# Patient Record
Sex: Male | Born: 1980 | Race: White | Hispanic: No | Marital: Married | Smoking: Never smoker
Health system: Southern US, Community
[De-identification: ages and names within clinical notes are randomized; demographics above are authoritative.]

## PROBLEM LIST (undated history)

## (undated) DIAGNOSIS — G43909 Migraine, unspecified, not intractable, without status migrainosus: Secondary | ICD-10-CM

## (undated) DIAGNOSIS — M549 Dorsalgia, unspecified: Secondary | ICD-10-CM

## (undated) DIAGNOSIS — S069X9A Unspecified intracranial injury with loss of consciousness of unspecified duration, initial encounter: Secondary | ICD-10-CM

## (undated) DIAGNOSIS — F4310 Post-traumatic stress disorder, unspecified: Secondary | ICD-10-CM

## (undated) DIAGNOSIS — S069XAA Unspecified intracranial injury with loss of consciousness status unknown, initial encounter: Secondary | ICD-10-CM

## (undated) DIAGNOSIS — H9190 Unspecified hearing loss, unspecified ear: Secondary | ICD-10-CM

## (undated) DIAGNOSIS — G8929 Other chronic pain: Secondary | ICD-10-CM

## (undated) HISTORY — PX: NASAL SEPTUM SURGERY: SHX37

## (undated) HISTORY — PX: HYDROCELE EXCISION: SHX482

---

## 2013-10-06 ENCOUNTER — Encounter (HOSPITAL_BASED_OUTPATIENT_CLINIC_OR_DEPARTMENT_OTHER): Payer: Self-pay | Admitting: Emergency Medicine

## 2013-10-06 ENCOUNTER — Emergency Department (HOSPITAL_BASED_OUTPATIENT_CLINIC_OR_DEPARTMENT_OTHER)
Admission: EM | Admit: 2013-10-06 | Discharge: 2013-10-06 | Disposition: A | Discharge status: Home / Self Care | Attending: Emergency Medicine | Admitting: Emergency Medicine

## 2013-10-06 ENCOUNTER — Emergency Department (HOSPITAL_BASED_OUTPATIENT_CLINIC_OR_DEPARTMENT_OTHER)

## 2013-10-06 DIAGNOSIS — R5381 Other malaise: Secondary | ICD-10-CM | POA: Insufficient documentation

## 2013-10-06 DIAGNOSIS — Z791 Long term (current) use of non-steroidal anti-inflammatories (NSAID): Secondary | ICD-10-CM | POA: Insufficient documentation

## 2013-10-06 DIAGNOSIS — M79609 Pain in unspecified limb: Secondary | ICD-10-CM | POA: Insufficient documentation

## 2013-10-06 DIAGNOSIS — F4310 Post-traumatic stress disorder, unspecified: Secondary | ICD-10-CM | POA: Insufficient documentation

## 2013-10-06 DIAGNOSIS — Z79899 Other long term (current) drug therapy: Secondary | ICD-10-CM | POA: Insufficient documentation

## 2013-10-06 DIAGNOSIS — R5383 Other fatigue: Secondary | ICD-10-CM

## 2013-10-06 DIAGNOSIS — M79641 Pain in right hand: Secondary | ICD-10-CM

## 2013-10-06 DIAGNOSIS — Z87820 Personal history of traumatic brain injury: Secondary | ICD-10-CM | POA: Insufficient documentation

## 2013-10-06 DIAGNOSIS — G8929 Other chronic pain: Secondary | ICD-10-CM | POA: Insufficient documentation

## 2013-10-06 DIAGNOSIS — H9190 Unspecified hearing loss, unspecified ear: Secondary | ICD-10-CM | POA: Insufficient documentation

## 2013-10-06 HISTORY — DX: Dorsalgia, unspecified: M54.9

## 2013-10-06 HISTORY — DX: Unspecified intracranial injury with loss of consciousness status unknown, initial encounter: S06.9XAA

## 2013-10-06 HISTORY — DX: Unspecified hearing loss, unspecified ear: H91.90

## 2013-10-06 HISTORY — DX: Post-traumatic stress disorder, unspecified: F43.10

## 2013-10-06 HISTORY — DX: Unspecified intracranial injury with loss of consciousness of unspecified duration, initial encounter: S06.9X9A

## 2013-10-06 HISTORY — DX: Other chronic pain: G89.29

## 2013-10-06 MED ORDER — HYDROCODONE-ACETAMINOPHEN 5-325 MG PO TABS: 1.0000 | ORAL_TABLET | Freq: Four times a day (QID) | ORAL | Status: DC | PRN

## 2013-10-06 MED ORDER — NAPROXEN 500 MG PO TABS: 500.0000 mg | ORAL_TABLET | Freq: Two times a day (BID) | ORAL | Status: DC

## 2013-10-06 NOTE — ED Provider Notes (Signed)
CSN: [id number]     Arrival date & time 10/06/13  1533 History   First MD Initiated Contact with Patient 10/06/13 1547     Chief Complaint  Patient presents with  . Wrist Pain     (Consider location/radiation/quality/duration/timing/severity/associated sxs/prior Treatment) Patient is a 33 y.o. male presenting with wrist pain. The history is provided by the patient.  Wrist Pain Pertinent negatives include no chest pain, no abdominal pain, no headaches and no shortness of breath.   patient with complaint of pain to the right hyperthenar area of his hand. Been present for 2 weeks no apparent injury. Patient denies any swelling or redness. Patient states his grip strength is weakened. No forearm pain. Pain does radiate into the wrist. Pain is 5/10. Pain is described as a sharp ache.  Past Medical History  Diagnosis Date  . Chronic back pain   . PTSD (post-traumatic stress disorder)   . TBI (traumatic brain injury)   . HOH (hard of hearing)    Past Surgical History  Procedure Laterality Date  . Back surgery     History reviewed. No pertinent family history. History  Substance Use Topics  . Smoking status: Not on file  . Smokeless tobacco: Not on file  . Alcohol Use: No    Review of Systems  Constitutional: Negative for fever.  HENT: Negative for congestion.   Eyes: Negative for redness.  Respiratory: Negative for shortness of breath.   Cardiovascular: Negative for chest pain.  Gastrointestinal: Negative for abdominal pain.  Genitourinary: Negative for dysuria.  Musculoskeletal: Negative for myalgias.  Skin: Negative for rash and wound.  Neurological: Positive for weakness. Negative for numbness and headaches.  Hematological: Does not bruise/bleed easily.  Psychiatric/Behavioral: Negative for confusion.      Allergies  Review of patient's allergies indicates no known allergies.  Home Medications   Prior to Admission medications   Medication Sig Start Date End Date  Taking? Authorizing Provider  desvenlafaxine (PRISTIQ) 50 MG 24 hr tablet Take 50 mg by mouth daily.   Yes Historical Provider, MD  meloxicam (MOBIC) 15 MG tablet Take 15 mg by mouth daily.   Yes Historical Provider, MD  zonisamide (ZONEGRAN) 100 MG capsule Take 300 mg by mouth daily.   Yes Historical Provider, MD  HYDROcodone-acetaminophen (NORCO/VICODIN) 5-325 MG per tablet Take 1-2 tablets by mouth every 6 (six) hours as needed for moderate pain. 10/06/13   Vanetta MuldersScott Crisanto Nied, MD  naproxen (NAPROSYN) 500 MG tablet Take 1 tablet (500 mg total) by mouth 2 (two) times daily. 10/06/13   Vanetta MuldersScott Cammi Consalvo, MD   Ht 6' (1.829 m)  Wt 210 lb (95.255 kg)  BMI 28.47 kg/m2 Physical Exam  Nursing note and vitals reviewed. Constitutional: He is oriented to person, place, and time. He appears well-developed and well-nourished. No distress.  HENT:  Head: Normocephalic and atraumatic.  Eyes: Conjunctivae and EOM are normal. Pupils are equal, round, and reactive to light.  Cardiovascular: Normal rate and regular rhythm.   Pulmonary/Chest: Effort normal and breath sounds normal.  Abdominal: Soft. Bowel sounds are normal.  Musculoskeletal: Normal range of motion. He exhibits no tenderness.  Right hand the nontender to palpation seems to have some swelling over the hyperthenar area. Good range of motion both making a fist opposition and extension. However grip is weakened on the fourth and fifth finger. Refill is 2 seconds. Sensation intact. No snuffbox tenderness. No ulnar-sided wrist tenderness. No erythema. No ecchymosis. No contusion.  Neurological: He is alert and oriented to  person, place, and time. No cranial nerve deficit. He exhibits normal muscle tone. Coordination normal.  Skin: Skin is warm. No erythema.    ED Course  Procedures (including critical care time) Labs Review Labs Reviewed - No data to display  Imaging Review Dg Wrist Complete Right  10/06/2013   CLINICAL DATA:  Ulnar-sided pain  without trauma.  EXAM: RIGHT HAND - COMPLETE 3+ VIEW; RIGHT WRIST - COMPLETE 3+ VIEW  COMPARISON:  None.  FINDINGS: Three views of the right hand demonstrate no fracture or dislocation. Joint spaces maintained.  Four views of the right wrist demonstrate no acute fracture dislocation. Scaphoid intact. Joint spaces maintained. No focal osseous lesion.  IMPRESSION: No acute findings about the right wrist or hand.   Electronically Signed   By: Jeronimo Greaves M.D.   On: 10/06/2013 16:20   Dg Hand Complete Right  10/06/2013   CLINICAL DATA:  Ulnar-sided pain without trauma.  EXAM: RIGHT HAND - COMPLETE 3+ VIEW; RIGHT WRIST - COMPLETE 3+ VIEW  COMPARISON:  None.  FINDINGS: Three views of the right hand demonstrate no fracture or dislocation. Joint spaces maintained.  Four views of the right wrist demonstrate no acute fracture dislocation. Scaphoid intact. Joint spaces maintained. No focal osseous lesion.  IMPRESSION: No acute findings about the right wrist or hand.   Electronically Signed   By: Jeronimo Greaves M.D.   On: 10/06/2013 16:20     EKG Interpretation None      MDM   Final diagnoses:  Hand pain, right    X-rays of the right hand and wrist without any bony injuries. Patient with tenderness predominately over the hyperthenar aspect of the right hand. Sensations intact capillary refills intact range of motion is intact. However patient has grip strength weakness due to the pain. Will refer to hand surgery for followup will place in a Velcro splint to rest the hand. We'll treat with anti-inflammatories. Additional pain medication provided as needed.    Vanetta Mulders, MD 10/06/13 [phone]

## 2013-10-06 NOTE — ED Notes (Signed)
Pt c/o right wrist injury x 2 weeks ago

## 2013-10-06 NOTE — Discharge Instructions (Signed)
Utilize the Velcro hand splint to rest the hand. Make an appointment to followup with hand surgery. X-rays were negative for any bony injuries. Return for any newer worse symptoms. Take Naprosyn on a regular basis. Supplement with the hydrocodone as needed for more severe pain.

## 2013-10-06 NOTE — ED Notes (Signed)
MD at bedside. 

## 2013-10-06 NOTE — ED Notes (Signed)
Patient transported to X-ray 

## 2014-02-21 ENCOUNTER — Emergency Department (HOSPITAL_BASED_OUTPATIENT_CLINIC_OR_DEPARTMENT_OTHER)
Admission: EM | Admit: 2014-02-21 | Discharge: 2014-02-22 | Disposition: A | Discharge status: Home / Self Care | Attending: Emergency Medicine | Admitting: Emergency Medicine

## 2014-02-21 ENCOUNTER — Encounter (HOSPITAL_BASED_OUTPATIENT_CLINIC_OR_DEPARTMENT_OTHER): Payer: Self-pay | Admitting: Emergency Medicine

## 2014-02-21 DIAGNOSIS — G43909 Migraine, unspecified, not intractable, without status migrainosus: Secondary | ICD-10-CM | POA: Diagnosis present

## 2014-02-21 DIAGNOSIS — Z791 Long term (current) use of non-steroidal anti-inflammatories (NSAID): Secondary | ICD-10-CM | POA: Insufficient documentation

## 2014-02-21 DIAGNOSIS — G43809 Other migraine, not intractable, without status migrainosus: Secondary | ICD-10-CM | POA: Insufficient documentation

## 2014-02-21 DIAGNOSIS — G8929 Other chronic pain: Secondary | ICD-10-CM | POA: Diagnosis not present

## 2014-02-21 DIAGNOSIS — Z79899 Other long term (current) drug therapy: Secondary | ICD-10-CM | POA: Diagnosis not present

## 2014-02-21 DIAGNOSIS — H9190 Unspecified hearing loss, unspecified ear: Secondary | ICD-10-CM | POA: Diagnosis not present

## 2014-02-21 DIAGNOSIS — Z87820 Personal history of traumatic brain injury: Secondary | ICD-10-CM | POA: Diagnosis not present

## 2014-02-21 DIAGNOSIS — F4310 Post-traumatic stress disorder, unspecified: Secondary | ICD-10-CM | POA: Insufficient documentation

## 2014-02-21 HISTORY — DX: Migraine, unspecified, not intractable, without status migrainosus: G43.909

## 2014-02-21 NOTE — ED Notes (Signed)
Migraine with vomiting started at 8pm.  Pt takes preventative migraine meds daily but takes them at bedtime and he was not able to keep them down tonight.

## 2014-02-22 MED ORDER — DEXAMETHASONE SODIUM PHOSPHATE 10 MG/ML IJ SOLN
10.0000 mg | Freq: Once | INTRAMUSCULAR | Status: AC
  Administered 2014-02-22: 10 mg via INTRAVENOUS
  Filled 2014-02-22: qty 1

## 2014-02-22 MED ORDER — DIPHENHYDRAMINE HCL 50 MG/ML IJ SOLN
25.0000 mg | Freq: Once | INTRAMUSCULAR | Status: AC
  Administered 2014-02-22: 25 mg via INTRAVENOUS
  Filled 2014-02-22: qty 1

## 2014-02-22 MED ORDER — METOCLOPRAMIDE HCL 5 MG/ML IJ SOLN
10.0000 mg | Freq: Once | INTRAMUSCULAR | Status: AC
  Administered 2014-02-22: 10 mg via INTRAVENOUS
  Filled 2014-02-22: qty 2

## 2014-02-22 MED ORDER — KETOROLAC TROMETHAMINE 30 MG/ML IJ SOLN
30.0000 mg | Freq: Once | INTRAMUSCULAR | Status: AC
  Administered 2014-02-22: 30 mg via INTRAVENOUS
  Filled 2014-02-22: qty 1

## 2014-02-22 MED ORDER — SODIUM CHLORIDE 0.9 % IV BOLUS (SEPSIS)
1000.0000 mL | Freq: Once | INTRAVENOUS | Status: AC
  Administered 2014-02-22: 1000 mL via INTRAVENOUS

## 2014-02-22 NOTE — Discharge Instructions (Signed)

## 2014-02-22 NOTE — ED Provider Notes (Signed)
CSN: [id number]     Arrival date & time 02/21/14  2317 History   First MD Initiated Contact with Patient 02/22/14 0101     Chief Complaint  Patient presents with  . Migraine     (Consider location/radiation/quality/duration/timing/severity/associated sxs/prior Treatment) HPI Comments: Patient is a 33 year old male with history of migraine headaches. He presents with severe headache that is frontal and associated with nausea and vomiting. He has medications at home but was unable to take them do to vomiting. He denies any visual disturbances. He denies any fevers, chills, or stiff neck.  Patient is a 33 y.o. male presenting with migraines. The history is provided by the patient.  Migraine This is a recurrent problem. The current episode started 3 to 5 hours ago. The problem occurs constantly. The problem has been rapidly worsening. Associated symptoms include headaches. Pertinent negatives include no chest pain. Nothing aggravates the symptoms. He has tried nothing for the symptoms. The treatment provided no relief.    Past Medical History  Diagnosis Date  . Chronic back pain   . PTSD (post-traumatic stress disorder)   . TBI (traumatic brain injury)   . HOH (hard of hearing)   . Migraine    Past Surgical History  Procedure Laterality Date  . Back surgery    . Nasal septum surgery     No family history on file. History  Substance Use Topics  . Smoking status: Never Smoker   . Smokeless tobacco: Not on file  . Alcohol Use: Yes     Comment: occ    Review of Systems  Cardiovascular: Negative for chest pain.  Neurological: Positive for headaches.  All other systems reviewed and are negative.     Allergies  Review of patient's allergies indicates no known allergies.  Home Medications   Prior to Admission medications   Medication Sig Start Date End Date Taking? Authorizing Provider  tapentadol (NUCYNTA) 50 MG TABS tablet Take 50 mg by mouth 2 (two) times daily as  needed.   Yes Historical Provider, MD  desvenlafaxine (PRISTIQ) 50 MG 24 hr tablet Take 50 mg by mouth daily.    Historical Provider, MD  HYDROcodone-acetaminophen (NORCO/VICODIN) 5-325 MG per tablet Take 1-2 tablets by mouth every 6 (six) hours as needed for moderate pain. 10/06/13   Vanetta MuldersScott Zackowski, MD  meloxicam (MOBIC) 15 MG tablet Take 15 mg by mouth daily.    Historical Provider, MD  naproxen (NAPROSYN) 500 MG tablet Take 1 tablet (500 mg total) by mouth 2 (two) times daily. 10/06/13   Vanetta MuldersScott Zackowski, MD  zonisamide (ZONEGRAN) 100 MG capsule Take 300 mg by mouth daily.    Historical Provider, MD   BP 118/72  Pulse 74  Temp(Src) 97.5 F (36.4 C) (Oral)  Resp 14  Ht 6' (1.829 m)  Wt 210 lb (95.255 kg)  BMI 28.47 kg/m2  SpO2 98% Physical Exam  Nursing note and vitals reviewed. Constitutional: He is oriented to person, place, and time. He appears well-developed and well-nourished. No distress.  HENT:  Head: Normocephalic and atraumatic.  Mouth/Throat: Oropharynx is clear and moist.  Eyes: EOM are normal. Pupils are equal, round, and reactive to light.  There is no papilledema.  Neck: Normal range of motion. Neck supple.  Cardiovascular: Normal rate, regular rhythm and normal heart sounds.   No murmur heard. Pulmonary/Chest: Effort normal and breath sounds normal. No respiratory distress. He has no wheezes.  Abdominal: Soft. Bowel sounds are normal. He exhibits no distension. There is no tenderness.  Musculoskeletal: Normal range of motion. He exhibits no edema.  Lymphadenopathy:    He has no cervical adenopathy.  Neurological: He is alert and oriented to person, place, and time. No cranial nerve deficit. He exhibits normal muscle tone. Coordination normal.  Skin: Skin is warm and dry. He is not diaphoretic.    ED Course  Procedures (including critical care time) Labs Review Labs Reviewed - No data to display  Imaging Review No results found.   EKG Interpretation None       MDM   Final diagnoses:  None    Patient feeling better with migraine cocktail. He is neurologically intact and I see no indication for imaging or LP. Will discharge to home with when necessary return.    Geoffery Lyonsouglas Hidaya Daniel, MD 02/22/14 [phone]

## 2014-02-22 NOTE — ED Notes (Signed)
MD at bedside. 

## 2014-11-15 ENCOUNTER — Encounter (HOSPITAL_BASED_OUTPATIENT_CLINIC_OR_DEPARTMENT_OTHER): Payer: Self-pay

## 2014-11-15 ENCOUNTER — Emergency Department (HOSPITAL_BASED_OUTPATIENT_CLINIC_OR_DEPARTMENT_OTHER)
Admission: EM | Admit: 2014-11-15 | Discharge: 2014-11-15 | Disposition: A | Discharge status: Home / Self Care | Attending: Emergency Medicine | Admitting: Emergency Medicine

## 2014-11-15 DIAGNOSIS — Z79899 Other long term (current) drug therapy: Secondary | ICD-10-CM | POA: Diagnosis not present

## 2014-11-15 DIAGNOSIS — F4310 Post-traumatic stress disorder, unspecified: Secondary | ICD-10-CM | POA: Diagnosis not present

## 2014-11-15 DIAGNOSIS — Z87828 Personal history of other (healed) physical injury and trauma: Secondary | ICD-10-CM | POA: Insufficient documentation

## 2014-11-15 DIAGNOSIS — R519 Headache, unspecified: Secondary | ICD-10-CM

## 2014-11-15 DIAGNOSIS — G43909 Migraine, unspecified, not intractable, without status migrainosus: Secondary | ICD-10-CM | POA: Diagnosis not present

## 2014-11-15 DIAGNOSIS — R51 Headache: Secondary | ICD-10-CM

## 2014-11-15 DIAGNOSIS — G8929 Other chronic pain: Secondary | ICD-10-CM | POA: Insufficient documentation

## 2014-11-15 DIAGNOSIS — H9190 Unspecified hearing loss, unspecified ear: Secondary | ICD-10-CM | POA: Insufficient documentation

## 2014-11-15 MED ORDER — METOCLOPRAMIDE HCL 5 MG/ML IJ SOLN
10.0000 mg | Freq: Once | INTRAMUSCULAR | Status: AC
  Administered 2014-11-15: 10 mg via INTRAVENOUS
  Filled 2014-11-15: qty 2

## 2014-11-15 MED ORDER — PROMETHAZINE HCL 25 MG PO TABS: 25.0000 mg | ORAL_TABLET | Freq: Four times a day (QID) | ORAL | Status: DC | PRN

## 2014-11-15 MED ORDER — KETOROLAC TROMETHAMINE 30 MG/ML IJ SOLN
30.0000 mg | Freq: Once | INTRAMUSCULAR | Status: AC
  Administered 2014-11-15: 30 mg via INTRAVENOUS
  Filled 2014-11-15: qty 1

## 2014-11-15 MED ORDER — DIPHENHYDRAMINE HCL 50 MG/ML IJ SOLN
25.0000 mg | Freq: Once | INTRAMUSCULAR | Status: AC
  Administered 2014-11-15: 25 mg via INTRAVENOUS
  Filled 2014-11-15: qty 1

## 2014-11-15 NOTE — ED Notes (Signed)
Pt reports migraine with hx of same, nausea, photophobia and blurred vision.

## 2014-11-15 NOTE — Discharge Instructions (Signed)
Please read and follow all provided instructions.  Your diagnoses today include:  1. Acute nonintractable headache, unspecified headache type     Tests performed today include:  Vital signs. See below for your results today.   Medications:  In the Emergency Department you received:  Reglan - antinausea/headache medication  Benadryl - antihistamine to counteract potential side effects of reglan  Toradol - NSAID medication similar to ibuprofen  Take any prescribed medications only as directed.  Additional information:  Follow any educational materials contained in this packet.  You are having a headache. No specific cause was found today for your headache. It may have been a migraine or other cause of headache. Stress, anxiety, fatigue, and depression are common triggers for headaches.   Your headache today does not appear to be life-threatening or require hospitalization, but often the exact cause of headaches is not determined in the emergency department. Therefore, follow-up with your doctor is very important to find out what may have caused your headache and whether or not you need any further diagnostic testing or treatment.   Sometimes headaches can appear benign (not harmful), but then more serious symptoms can develop which should prompt an immediate re-evaluation by your doctor or the emergency department.  BE VERY CAREFUL not to take multiple medicines containing Tylenol (also called acetaminophen). Doing so can lead to an overdose which can damage your liver and cause liver failure and possibly death.   Follow-up instructions: Please follow-up with your primary care provider in the next 3 days for further evaluation of your symptoms.   Return instructions:   Please return to the Emergency Department if you experience worsening symptoms.  Return if the medications do not resolve your headache, if it recurs, or if you have multiple episodes of vomiting or cannot keep  down fluids.  Return if you have a change from the usual headache.  RETURN IMMEDIATELY IF you:  Develop a sudden, severe headache  Develop confusion or become poorly responsive or faint  Develop a fever above 100.30F or problem breathing  Have a change in speech, vision, swallowing, or understanding  Develop new weakness, numbness, tingling, incoordination in your arms or legs  Have a seizure  Please return if you have any other emergent concerns.  Additional Information:  Your vital signs today were: BP 131/81 mmHg   Pulse 84   Temp(Src) 98.1 F (36.7 C) (Oral)   Resp 15   Ht 6' (1.829 m)   Wt 220 lb (99.791 kg)   BMI 29.83 kg/m2   SpO2 100% If your blood pressure (BP) was elevated above 135/85 this visit, please have this repeated by your doctor within one month. --------------

## 2014-11-15 NOTE — ED Notes (Signed)
PA at bedside.

## 2014-11-15 NOTE — ED Provider Notes (Signed)
CSN: [id number]     Arrival date & time 11/15/14  1757 History   First MD Initiated Contact with Patient 11/15/14 1812     Chief Complaint  Patient presents with  . Migraine     (Consider location/radiation/quality/duration/timing/severity/associated sxs/prior Treatment) HPI Comments: Patient with history of migraine headaches presents with complaint of headache onset around 1 PM with aura. Patient saw spots in his visit which is typical. He describes a bilateral frontal headache. He does not have medications at home. He has had nausea but no vomiting. Positive photophobia and phonophobia. No treatments prior to arrival. Patient is out of his home abortive therapies. No neck pain or fever. No head injury. No sinus pressure or dental pain. Symptoms are typical for migraine headaches.   The history is provided by the patient and medical records.    Past Medical History  Diagnosis Date  . Chronic back pain   . PTSD (post-traumatic stress disorder)   . TBI (traumatic brain injury)   . HOH (hard of hearing)   . Migraine    Past Surgical History  Procedure Laterality Date  . Back surgery    . Nasal septum surgery     No family history on file. History  Substance Use Topics  . Smoking status: Never Smoker   . Smokeless tobacco: Not on file  . Alcohol Use: No     Comment: occ    Review of Systems  Constitutional: Negative for fever.  HENT: Negative for congestion, dental problem, rhinorrhea and sinus pressure.   Eyes: Positive for photophobia and visual disturbance. Negative for discharge and redness.  Respiratory: Negative for shortness of breath.   Cardiovascular: Negative for chest pain.  Gastrointestinal: Negative for nausea and vomiting.  Musculoskeletal: Negative for gait problem, neck pain and neck stiffness.  Skin: Negative for rash.  Neurological: Positive for headaches. Negative for syncope, speech difficulty, weakness, light-headedness and numbness.   Psychiatric/Behavioral: Negative for confusion.      Allergies  Review of patient's allergies indicates no known allergies.  Home Medications   Prior to Admission medications   Medication Sig Start Date End Date Taking? Authorizing Provider  desvenlafaxine (PRISTIQ) 50 MG 24 hr tablet Take 50 mg by mouth daily.    Historical Provider, MD  HYDROcodone-acetaminophen (NORCO/VICODIN) 5-325 MG per tablet Take 1-2 tablets by mouth every 6 (six) hours as needed for moderate pain. 10/06/13   Vanetta Mulders, MD  meloxicam (MOBIC) 15 MG tablet Take 15 mg by mouth daily.    Historical Provider, MD  naproxen (NAPROSYN) 500 MG tablet Take 1 tablet (500 mg total) by mouth 2 (two) times daily. 10/06/13   Vanetta Mulders, MD  tapentadol (NUCYNTA) 50 MG TABS tablet Take 50 mg by mouth 2 (two) times daily as needed.    Historical Provider, MD  zonisamide (ZONEGRAN) 100 MG capsule Take 300 mg by mouth daily.    Historical Provider, MD   BP 131/81 mmHg  Pulse 84  Temp(Src) 98.1 F (36.7 C) (Oral)  Resp 15  Ht 6' (1.829 m)  Wt 220 lb (99.791 kg)  BMI 29.83 kg/m2  SpO2 100% Physical Exam  Constitutional: He is oriented to person, place, and time. He appears well-developed and well-nourished.  Lying in dark room with blankets over his head.  HENT:  Head: Normocephalic and atraumatic.  Right Ear: Tympanic membrane, external ear and ear canal normal.  Left Ear: Tympanic membrane, external ear and ear canal normal.  Nose: Nose normal.  Mouth/Throat: Uvula is  midline, oropharynx is clear and moist and mucous membranes are normal.  Eyes: Conjunctivae, EOM and lids are normal. Pupils are equal, round, and reactive to light.  Neck: Normal range of motion. Neck supple.  Cardiovascular: Normal rate and regular rhythm.   Pulmonary/Chest: Effort normal and breath sounds normal.  Abdominal: Soft. There is no tenderness.  Musculoskeletal: Normal range of motion.       Cervical back: He exhibits normal range  of motion, no tenderness and no bony tenderness.  Neurological: He is alert and oriented to person, place, and time. He has normal strength and normal reflexes. No cranial nerve deficit or sensory deficit. He exhibits normal muscle tone. He displays a negative Romberg sign. Coordination and gait normal. GCS eye subscore is 4. GCS verbal subscore is 5. GCS motor subscore is 6.  Skin: Skin is warm and dry.  Psychiatric: He has a normal mood and affect.  Nursing note and vitals reviewed.   ED Course  Procedures (including critical care time) Labs Review Labs Reviewed - No data to display  Imaging Review No results found.   EKG Interpretation None       6:37 PM Patient seen and examined. Work-up initiated. Medications ordered.   Vital signs reviewed and are as follows: BP 131/81 mmHg  Pulse 84  Temp(Src) 98.1 F (36.7 C) (Oral)  Resp 15  Ht 6' (1.829 m)  Wt 220 lb (99.791 kg)  BMI 29.83 kg/m2  SpO2 100%  7:25 PM patient's headache much improved to 2 out of 10. He is ready for discharge to home. Encouraged rest. Will discharge to home with Phenergan.    MDM   Final diagnoses:  Acute nonintractable headache, unspecified headache type   Patient with his typical migraine headache. Patient without high-risk features of headache including: sudden onset/thunderclap HA, no similar headache in past, altered mental status, accompanying seizure, headache with exertion, age > 3550, history of immunocompromise, neck or shoulder pain, fever, use of anticoagulation, family history of spontaneous SAH, concomitant drug use, toxic exposure.   Patient has a normal complete neurological exam, normal vital signs, normal level of consciousness, no signs of meningismus, is well-appearing/non-toxic appearing, no signs of trauma.  Imaging with CT/MRI not indicated given history and physical exam findings.   No dangerous or life-threatening conditions suspected or identified by history, physical exam,  and by work-up. No indications for hospitalization identified.      Renne CriglerJoshua Malic Rosten, PA-C 11/15/14 1926  Pricilla LovelessScott Goldston, MD 11/18/14 [phone]

## 2015-08-02 ENCOUNTER — Encounter (HOSPITAL_BASED_OUTPATIENT_CLINIC_OR_DEPARTMENT_OTHER): Payer: Self-pay | Admitting: *Deleted

## 2015-08-02 ENCOUNTER — Emergency Department (HOSPITAL_BASED_OUTPATIENT_CLINIC_OR_DEPARTMENT_OTHER)
Admission: EM | Admit: 2015-08-02 | Discharge: 2015-08-02 | Disposition: A | Discharge status: Home / Self Care | Attending: Emergency Medicine | Admitting: Emergency Medicine

## 2015-08-02 DIAGNOSIS — G8929 Other chronic pain: Secondary | ICD-10-CM | POA: Insufficient documentation

## 2015-08-02 DIAGNOSIS — Z791 Long term (current) use of non-steroidal anti-inflammatories (NSAID): Secondary | ICD-10-CM | POA: Diagnosis not present

## 2015-08-02 DIAGNOSIS — Z87828 Personal history of other (healed) physical injury and trauma: Secondary | ICD-10-CM | POA: Insufficient documentation

## 2015-08-02 DIAGNOSIS — F4310 Post-traumatic stress disorder, unspecified: Secondary | ICD-10-CM | POA: Diagnosis not present

## 2015-08-02 DIAGNOSIS — Z79899 Other long term (current) drug therapy: Secondary | ICD-10-CM | POA: Diagnosis not present

## 2015-08-02 DIAGNOSIS — R51 Headache: Secondary | ICD-10-CM | POA: Insufficient documentation

## 2015-08-02 DIAGNOSIS — H9190 Unspecified hearing loss, unspecified ear: Secondary | ICD-10-CM | POA: Diagnosis not present

## 2015-08-02 DIAGNOSIS — R519 Headache, unspecified: Secondary | ICD-10-CM

## 2015-08-02 MED ORDER — METOCLOPRAMIDE HCL 5 MG/ML IJ SOLN
10.0000 mg | Freq: Once | INTRAMUSCULAR | Status: AC
  Administered 2015-08-02: 10 mg via INTRAVENOUS
  Filled 2015-08-02: qty 2

## 2015-08-02 MED ORDER — KETOROLAC TROMETHAMINE 30 MG/ML IJ SOLN
30.0000 mg | Freq: Once | INTRAMUSCULAR | Status: AC
  Administered 2015-08-02: 30 mg via INTRAVENOUS
  Filled 2015-08-02: qty 1

## 2015-08-02 MED ORDER — DIPHENHYDRAMINE HCL 50 MG/ML IJ SOLN
50.0000 mg | Freq: Once | INTRAMUSCULAR | Status: AC
  Administered 2015-08-02: 50 mg via INTRAVENOUS
  Filled 2015-08-02: qty 1

## 2015-08-02 NOTE — ED Provider Notes (Signed)
CSN: [id number]     Arrival date & time 08/02/15  0018 History   First MD Initiated Contact with Patient 08/02/15 0110     Chief Complaint  Patient presents with  . Migraine     (Consider location/radiation/quality/duration/timing/severity/associated sxs/prior Treatment) HPI  Jerry Orr is a 35 y.o. male with past medical history of chronic migraines presenting today with recurrent headache. He states this began 6 hours prior to arrival. He has nausea as well. This is his normal frontal headache which she has been seen here before. He denies his headache being the most severe he's ever had. He denies any neurological symptoms associated. He has no fevers or recent infections. Patient has no further complaints.  10 Systems reviewed and are negative for acute change except as noted in the HPI.      Past Medical History  Diagnosis Date  . Chronic back pain   . PTSD (post-traumatic stress disorder)   . TBI (traumatic brain injury) (HCC)   . HOH (hard of hearing)   . Migraine    Past Surgical History  Procedure Laterality Date  . Back surgery    . Nasal septum surgery     No family history on file. Social History  Substance Use Topics  . Smoking status: Never Smoker   . Smokeless tobacco: None  . Alcohol Use: No     Comment: occ    Review of Systems    Allergies  Review of patient's allergies indicates no known allergies.  Home Medications   Prior to Admission medications   Medication Sig Start Date End Date Taking? Authorizing Provider  desvenlafaxine (PRISTIQ) 50 MG 24 hr tablet Take 50 mg by mouth daily.    Historical Provider, MD  meloxicam (MOBIC) 15 MG tablet Take 15 mg by mouth daily.    Historical Provider, MD  promethazine (PHENERGAN) 25 MG tablet Take 1 tablet (25 mg total) by mouth every 6 (six) hours as needed for nausea or vomiting. 11/15/14   Renne Crigler, PA-C  tapentadol (NUCYNTA) 50 MG TABS tablet Take 50 mg by mouth 2 (two) times daily as needed.     Historical Provider, MD  zonisamide (ZONEGRAN) 100 MG capsule Take 300 mg by mouth daily.    Historical Provider, MD   BP 136/96 mmHg  Pulse 82  Temp(Src) 98.2 F (36.8 C) (Oral)  Resp 18  Wt 210 lb (95.255 kg)  SpO2 99% Physical Exam  Constitutional: He is oriented to person, place, and time. Vital signs are normal. He appears well-developed and well-nourished.  Non-toxic appearance. He does not appear ill. No distress.  HENT:  Head: Normocephalic and atraumatic.  Nose: Nose normal.  Mouth/Throat: Oropharynx is clear and moist. No oropharyngeal exudate.  Eyes: Conjunctivae and EOM are normal. Pupils are equal, round, and reactive to light. No scleral icterus.  Neck: Normal range of motion. Neck supple. No tracheal deviation, no edema, no erythema and normal range of motion present. No thyroid mass and no thyromegaly present.  Cardiovascular: Normal rate, regular rhythm, S1 normal, S2 normal, normal heart sounds, intact distal pulses and normal pulses.  Exam reveals no gallop and no friction rub.   No murmur heard. Pulmonary/Chest: Effort normal and breath sounds normal. No respiratory distress. He has no wheezes. He has no rhonchi. He has no rales.  Abdominal: Soft. Normal appearance and bowel sounds are normal. He exhibits no distension, no ascites and no mass. There is no hepatosplenomegaly. There is no tenderness. There is no rebound,  no guarding and no CVA tenderness.  Musculoskeletal: Normal range of motion. He exhibits no edema or tenderness.  Lymphadenopathy:    He has no cervical adenopathy.  Neurological: He is alert and oriented to person, place, and time. He has normal strength. No cranial nerve deficit or sensory deficit.  Normal strength and sensation in all extremities. Normal cerebellar testing. Normal gait.  Skin: Skin is warm, dry and intact. No petechiae and no rash noted. He is not diaphoretic. No erythema. No pallor.  Psychiatric: He has a normal mood and affect. His  behavior is normal. Judgment normal.  Nursing note and vitals reviewed.   ED Course  Procedures (including critical care time) Labs Review Labs Reviewed - No data to display  Imaging Review No results found. I have personally reviewed and evaluated these images and lab results as part of my medical decision-making.   EKG Interpretation None      MDM   Final diagnoses:  None    Patient presents emergency department for headache. This appears to be consistent with his prior headache attacks. He states the cocktail given to him usually works. Looking back in his 2 visits he received Toradol, Reglan, and Benadryl. This was given to him again. We'll continue to monitor for improvement.  Upon repeat evaluation, patients headaches has resolved.  Will DC with reglan to use at home as needed.  He appears well and in NAD. VS remain within his normal limits and he is safe for DC  Tomasita CrumbleAdeleke Jaekwon Mcclune, MD 08/02/15 0500

## 2015-08-02 NOTE — ED Notes (Signed)
Primary assessment note typed in on this Pt. Is under wrong Pt. And unable to be deleted by RN Earlene Plateravis.  RN Earlene Plateravis typed note.

## 2015-08-02 NOTE — Discharge Instructions (Signed)
Recurrent Migraine Headache Jerry Orr, continue your normal headache medication and see your primary care doctor within 3 days for close follow up.  If any symptoms worsen, come back to the ED immediately. Thank you. A migraine headache is very bad, throbbing pain on one or both sides of your head. Recurrent migraines keep coming back. Talk to your doctor about what things may bring on (trigger) your migraine headaches. HOME CARE  Only take medicines as told by your doctor.  Lie down in a dark, quiet room when you have a migraine.  Keep a journal to find out if certain things bring on migraine headaches. For example, write down:  What you eat and drink.  How much sleep you get.  Any change to your diet or medicines.  Lessen how much alcohol you drink.  Quit smoking if you smoke.  Get enough sleep.  Lessen any stress in your life.  Keep lights dim if bright lights bother you or make your migraines worse. GET HELP IF:  Medicine does not help your migraines.  Your pain keeps coming back.  You have a fever. GET HELP RIGHT AWAY IF:   Your migraine becomes really bad.  You have a stiff neck.  You have trouble seeing.  Your muscles are weak, or you lose muscle control.  You lose your balance or have trouble walking.  You feel like you will pass out (faint), or you pass out.  You have really bad symptoms that are different than your first symptoms. MAKE SURE YOU:   Understand these instructions.  Will watch your condition.  Will get help right away if you are not doing well or get worse.   This information is not intended to replace advice given to you by your health care provider. Make sure you discuss any questions you have with your health care provider.   Document Released: 02/04/2008 Document Revised: 05/02/2013 Document Reviewed: 01/02/2013 Elsevier Interactive Patient Education [employer]2016 Elsevier Inc.

## 2015-08-02 NOTE — ED Notes (Signed)
Pt. Reports history of migraines with having one at present time.  Pt.reports pain in front of head forehead pain 7/10.  No vomiting only nausea.

## 2015-08-02 NOTE — ED Notes (Signed)
C/o ha x 6 hours w n/v

## 2015-09-01 ENCOUNTER — Emergency Department (HOSPITAL_BASED_OUTPATIENT_CLINIC_OR_DEPARTMENT_OTHER)
Admission: EM | Admit: 2015-09-01 | Discharge: 2015-09-01 | Disposition: A | Discharge status: Home / Self Care | Attending: Emergency Medicine | Admitting: Emergency Medicine

## 2015-09-01 ENCOUNTER — Encounter (HOSPITAL_BASED_OUTPATIENT_CLINIC_OR_DEPARTMENT_OTHER): Payer: Self-pay | Admitting: *Deleted

## 2015-09-01 DIAGNOSIS — M5441 Lumbago with sciatica, right side: Secondary | ICD-10-CM | POA: Insufficient documentation

## 2015-09-01 MED ORDER — DEXAMETHASONE SODIUM PHOSPHATE 10 MG/ML IJ SOLN
10.0000 mg | Freq: Once | INTRAMUSCULAR | Status: AC
  Administered 2015-09-01: 10 mg via INTRAMUSCULAR
  Filled 2015-09-01: qty 1

## 2015-09-01 MED ORDER — HYDROMORPHONE HCL 1 MG/ML IJ SOLN
2.0000 mg | Freq: Once | INTRAMUSCULAR | Status: AC
  Administered 2015-09-01: 2 mg via INTRAMUSCULAR
  Filled 2015-09-01: qty 2

## 2015-09-01 NOTE — ED Notes (Signed)
Back pain x 2 days since bending over.  Increased pain with movement.

## 2015-09-01 NOTE — Discharge Instructions (Signed)
Myelography Myelography is an X-ray test that uses a special dye to look at your spine or neck. This test is usually done to look for:  Spinal cord injury.  Disk ruptures.  Fluid-filled pockets of tissue (cysts) on your spinal cord or nerve roots.  Tumors on your spinal cord or nerve roots. BEFORE THE PROCEDURE Arrange for someone to drive you home after the test.  PROCEDURE  You will lie on your stomach during the procedure.  Medicine may be given to you to help you relax.  A numbing medicine will be applied to area that they will be injecting you with a needle.  A needle will be inserted between two of your backbones.  A special machine will be used to help your doctor guide the needle into the sac that surrounds your spinal cord and nerves. A special dye will be injected into this sac.  The table you lie on may be moved around to make sure the dye moves all around your spinal cord and nerves.  Pictures of the area will be taken by X-ray or CT. AFTER THE PROCEDURE  You will be taken to a recovery area.  You will lie flat with your head in a raised position. This is to prevent a severe headache.   This information is not intended to replace advice given to you by your health care provider. Make sure you discuss any questions you have with your health care provider.   Document Released: 02/04/2008 Document Revised: 05/18/2014 Document Reviewed: 01/20/2012 Elsevier Interactive Patient Education 2016 Elsevier Inc.  Sciatica Sciatica is pain, weakness, numbness, or tingling along your sciatic nerve. The nerve starts in the lower back and runs down the back of each leg. Nerve damage or certain conditions pinch or put pressure on the sciatic nerve. This causes the pain, weakness, and other discomforts of sciatica. HOME CARE   Only take medicine as told by your doctor.  Apply ice to the affected area for 20 minutes. Do this 3-4 times a day for the first 48-72 hours. Then try  heat in the same way.  Exercise, stretch, or do your usual activities if these do not make your pain worse.  Go to physical therapy as told by your doctor.  Keep all doctor visits as told.  Do not wear high heels or shoes that are not supportive.  Get a firm mattress if your mattress is too soft to lessen pain and discomfort. GET HELP RIGHT AWAY IF:   You cannot control when you poop (bowel movement) or pee (urinate).  You have more weakness in your lower back, lower belly (pelvis), butt (buttocks), or legs.  You have redness or puffiness (swelling) of your back.  You have a burning feeling when you pee.  You have pain that gets worse when you lie down.  You have pain that wakes you from your sleep.  Your pain is worse than past pain.  Your pain lasts longer than 4 weeks.  You are suddenly losing weight without reason. MAKE SURE YOU:   Understand these instructions.  Will watch this condition.  Will get help right away if you are not doing well or get worse.   This information is not intended to replace advice given to you by your health care provider. Make sure you discuss any questions you have with your health care provider.   Document Released: 02/04/2008 Document Revised: 01/16/2015 Document Reviewed: 09/06/2011 Elsevier Interactive Patient Education [employer]vier Inc.
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
## 2015-10-13 IMAGING — CR DG HAND COMPLETE 3+V*R*
3 series · 3 of 3 positions shown · non-contrast
Comparison: None.

CLINICAL DATA: Ulnar-sided pain without trauma.

EXAM:
RIGHT HAND - COMPLETE 3+ VIEW; RIGHT WRIST - COMPLETE 3+ VIEW

[x hand pa right]
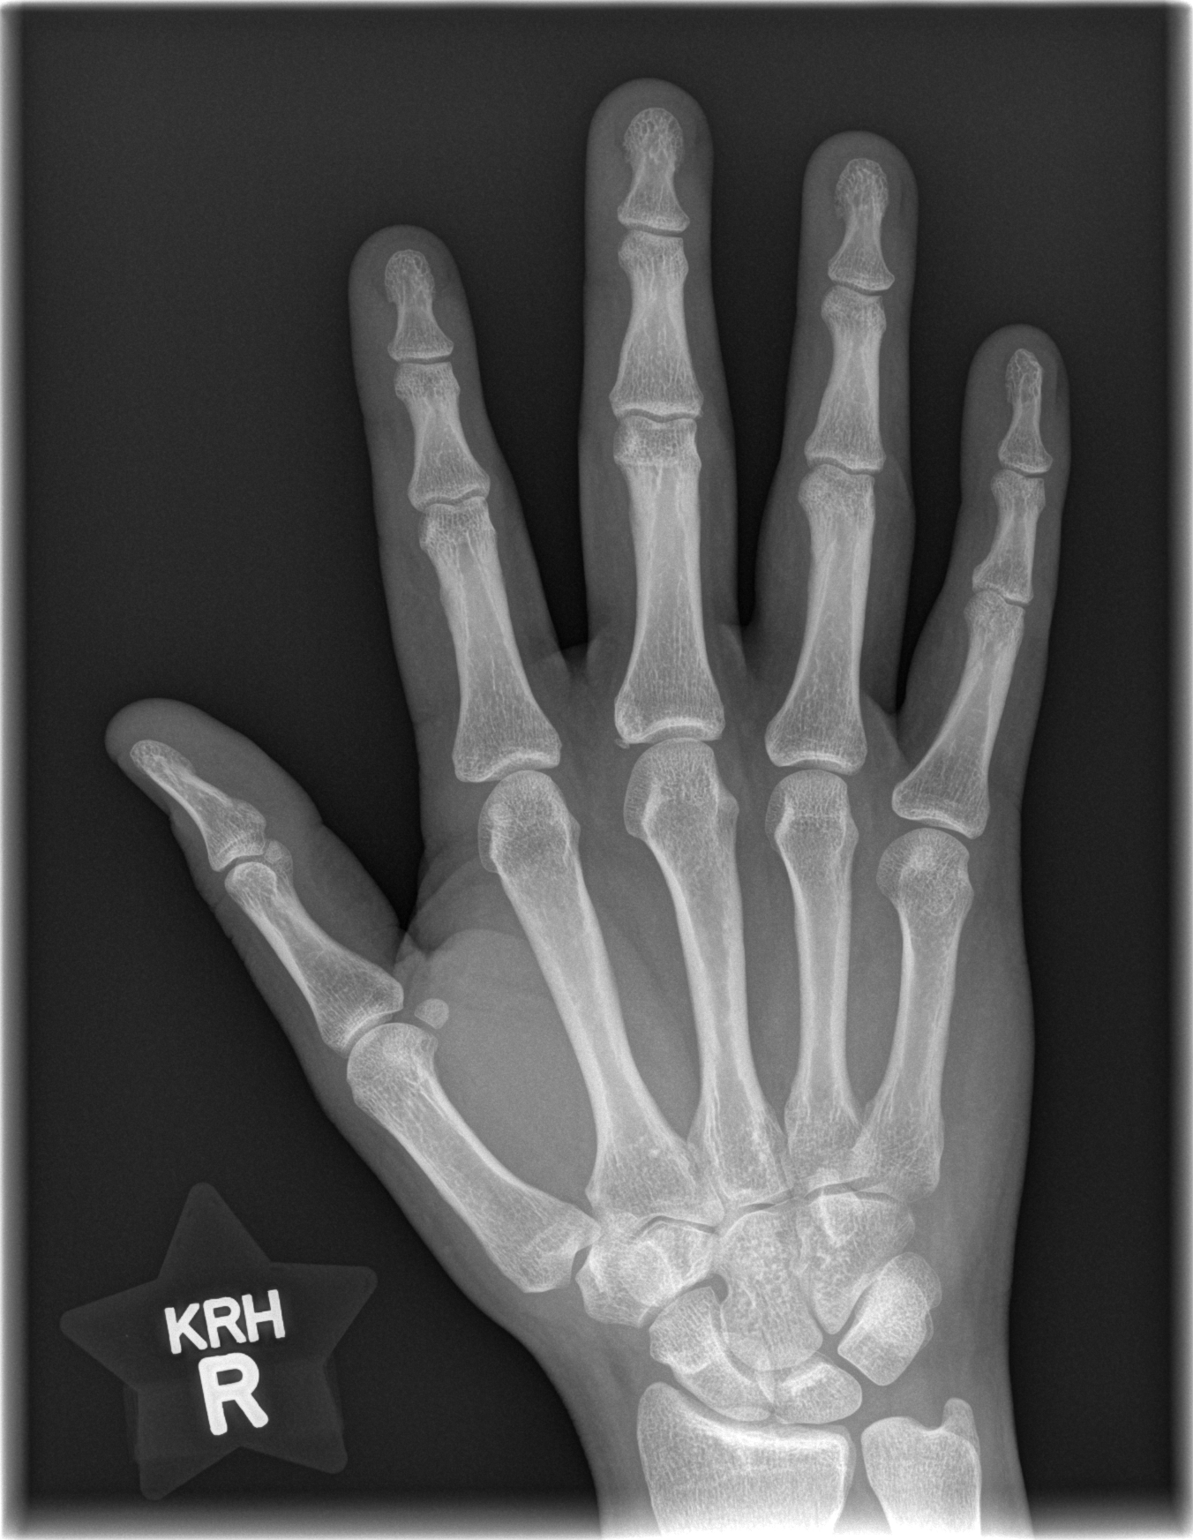

[x hand oblique right]
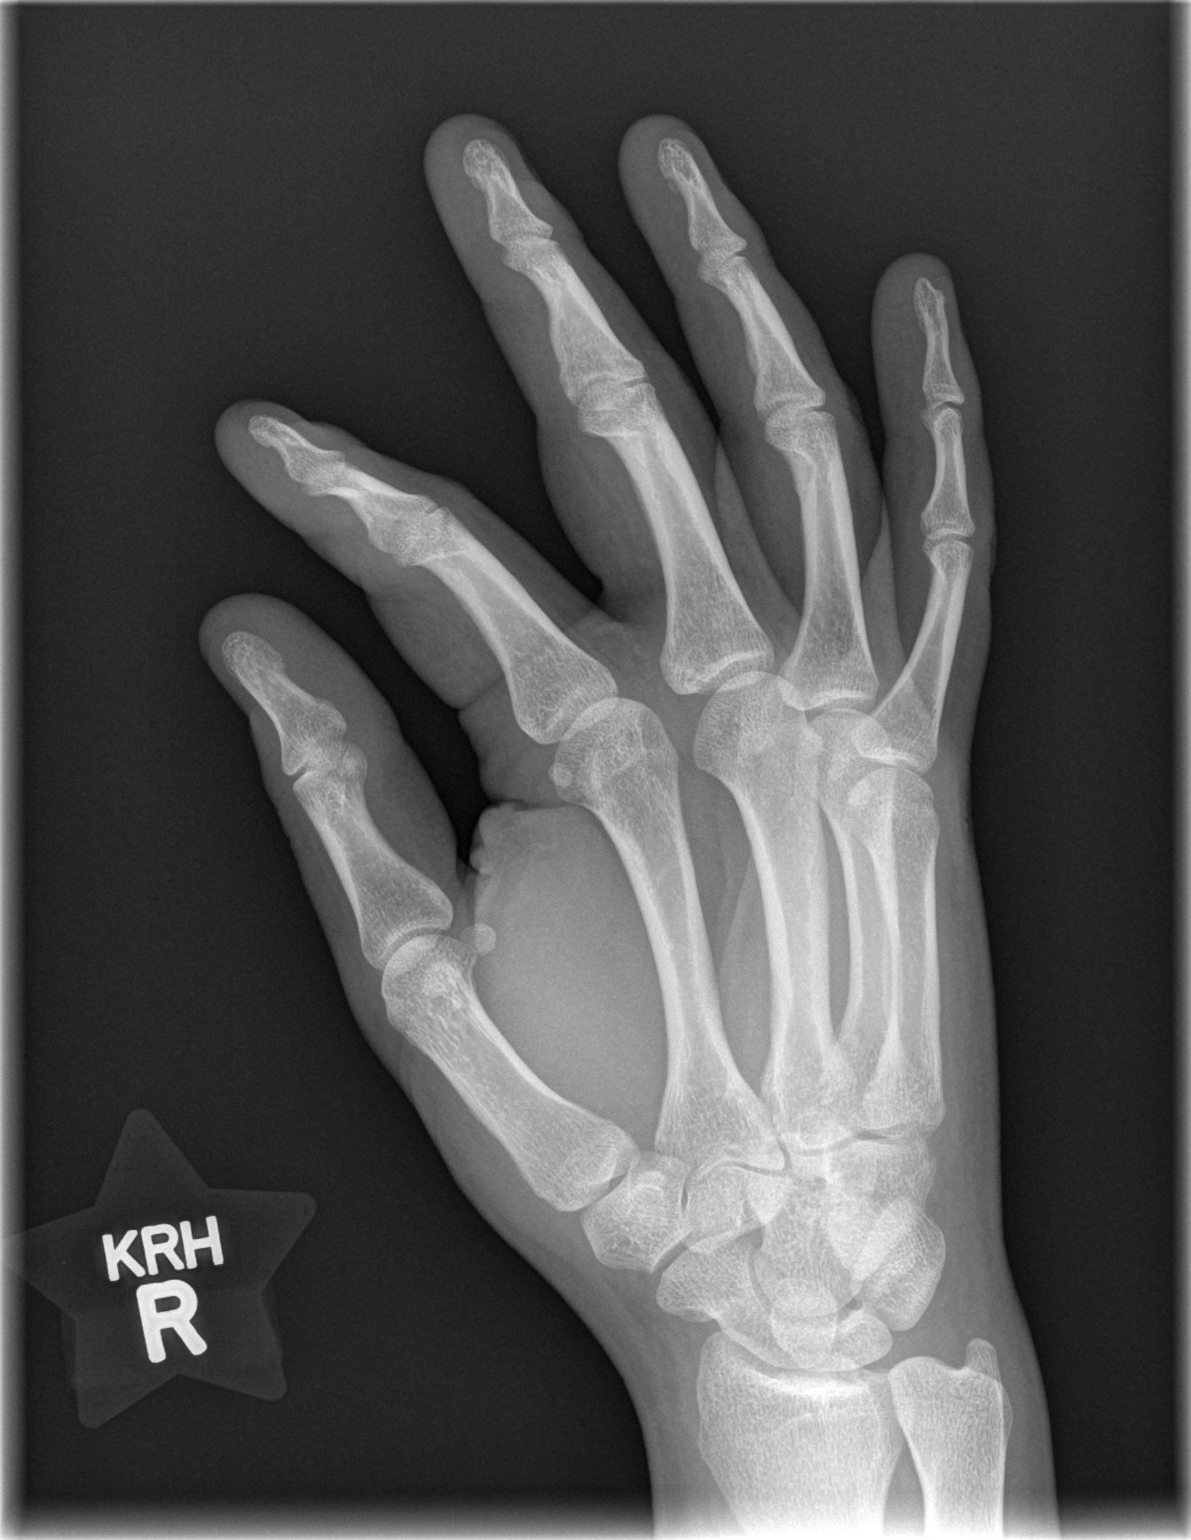

[x hand lat right]
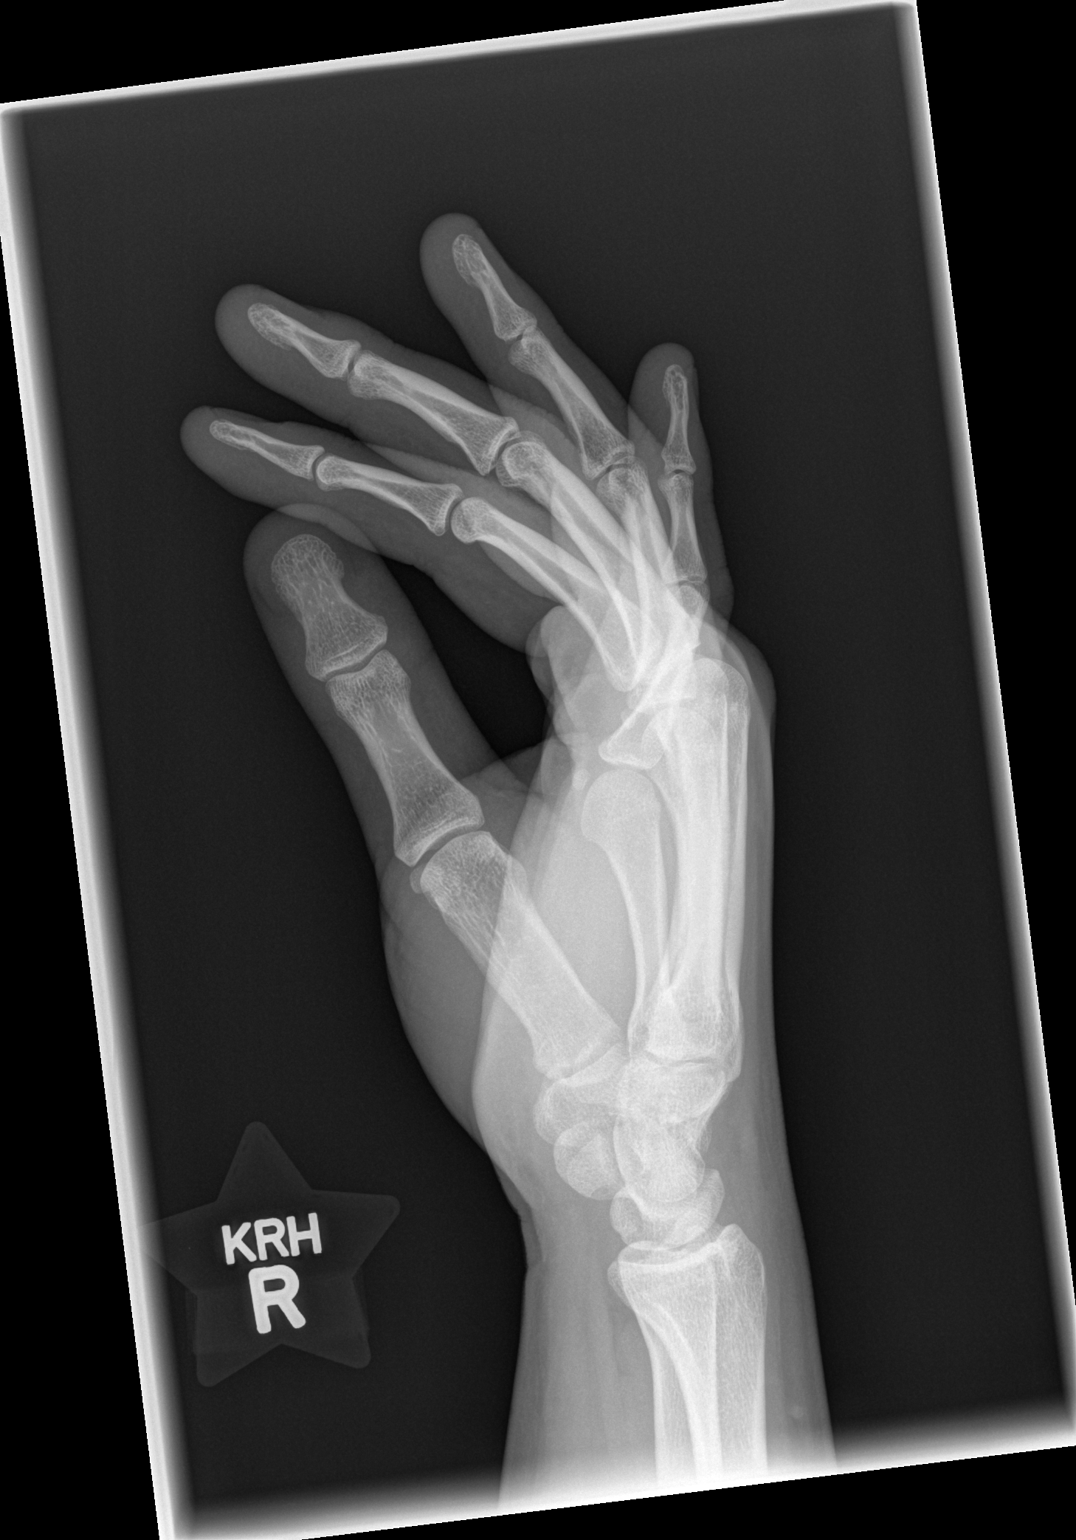

[3 of 3 positions shown; findings below may reference images not displayed]

FINDINGS: Three views of the right hand demonstrate no fracture or
dislocation. Joint spaces maintained.

Four views of the right wrist demonstrate no acute fracture
dislocation. Scaphoid intact. Joint spaces maintained. No focal
osseous lesion.
IMPRESSION: No acute findings about the right wrist or hand.

## 2015-10-13 IMAGING — CR DG WRIST COMPLETE 3+V*R*
4 series · 4 of 4 positions shown · non-contrast
Comparison: None.

CLINICAL DATA: Ulnar-sided pain without trauma.

EXAM:
RIGHT HAND - COMPLETE 3+ VIEW; RIGHT WRIST - COMPLETE 3+ VIEW

[x wrist pa right]
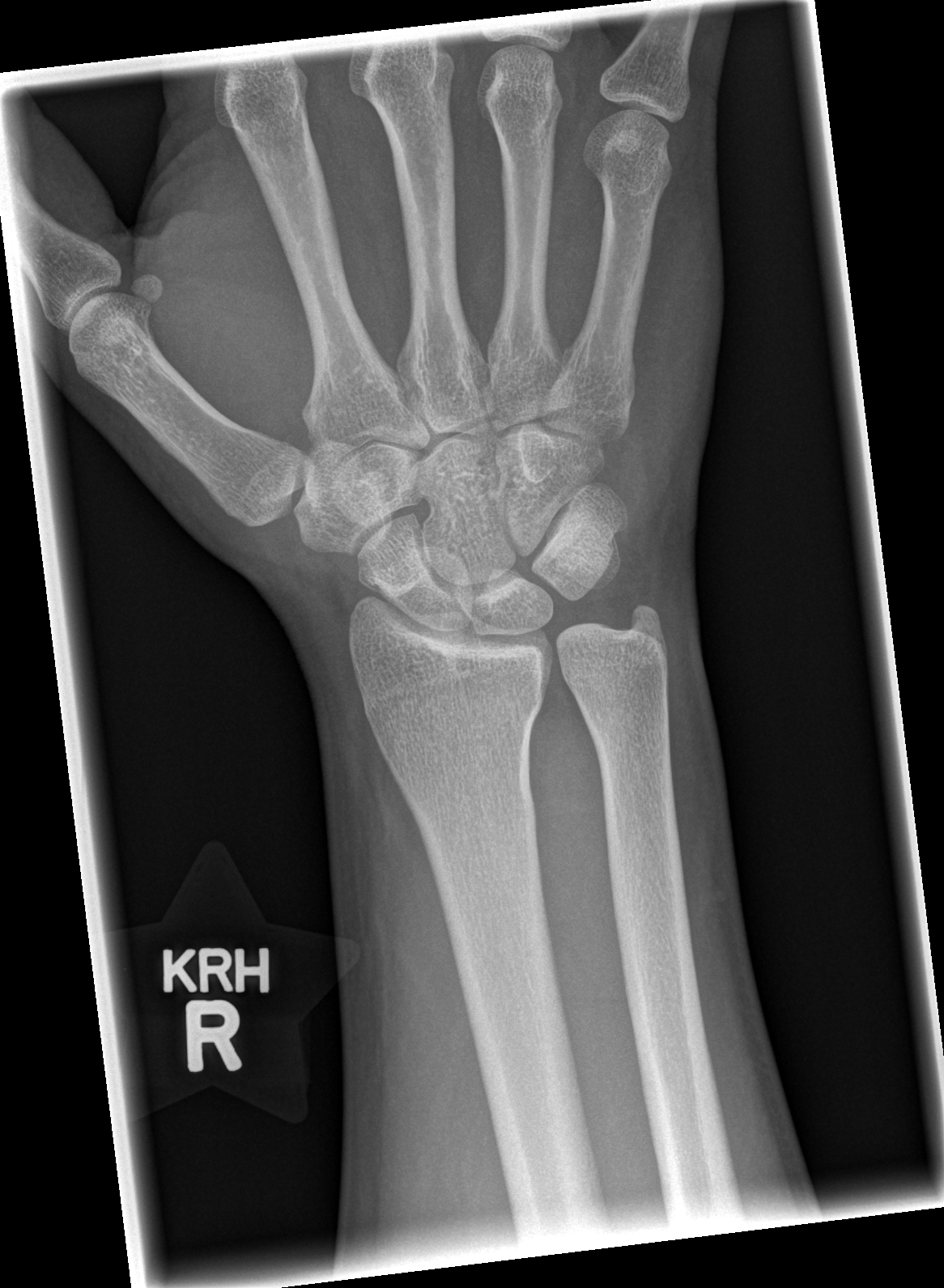

[x wrist obl right]
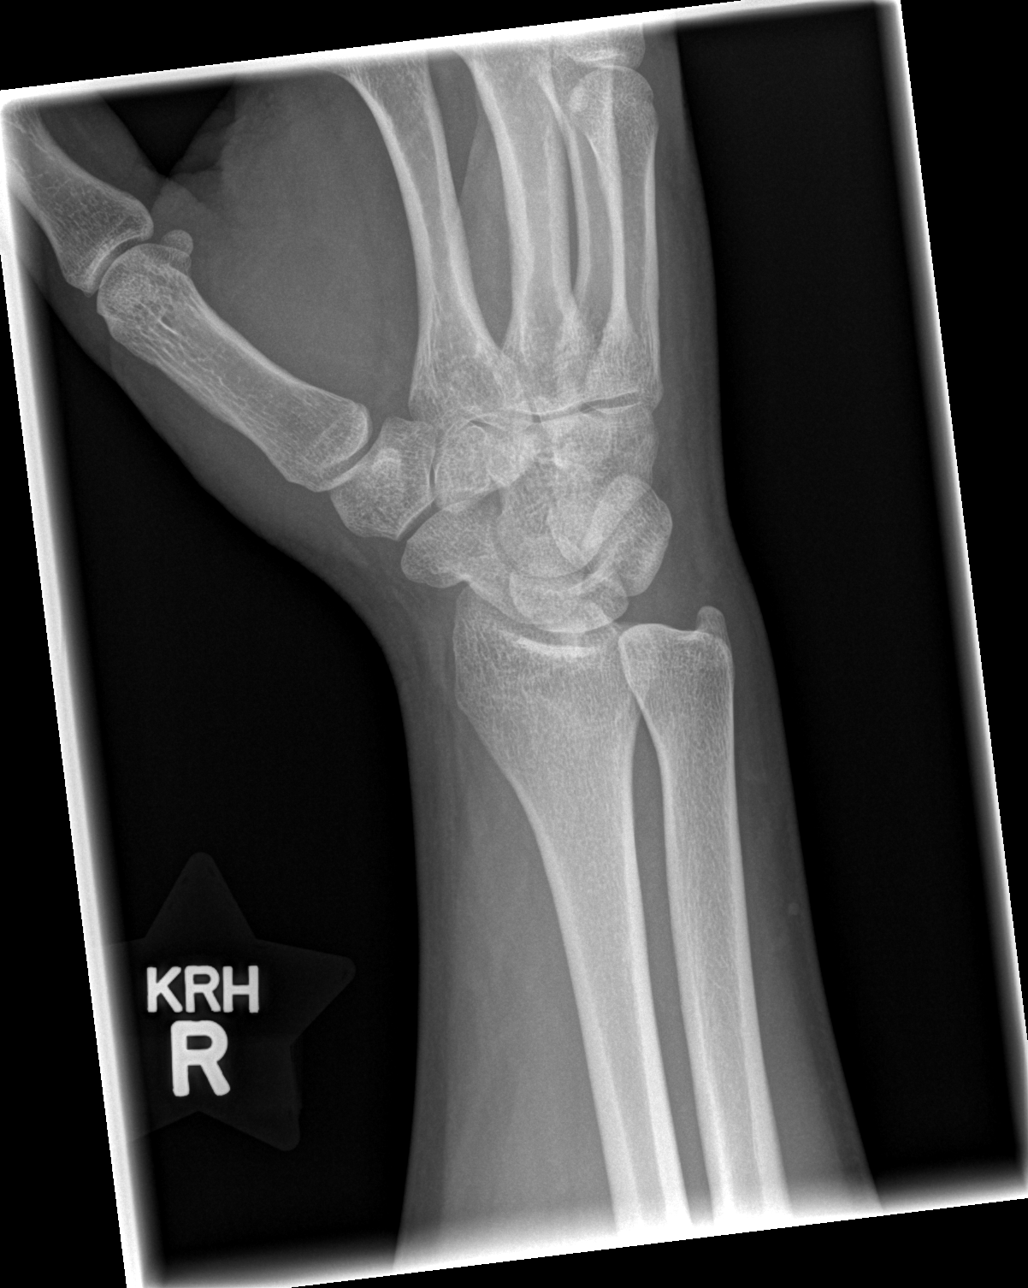

[x wrist lat right]
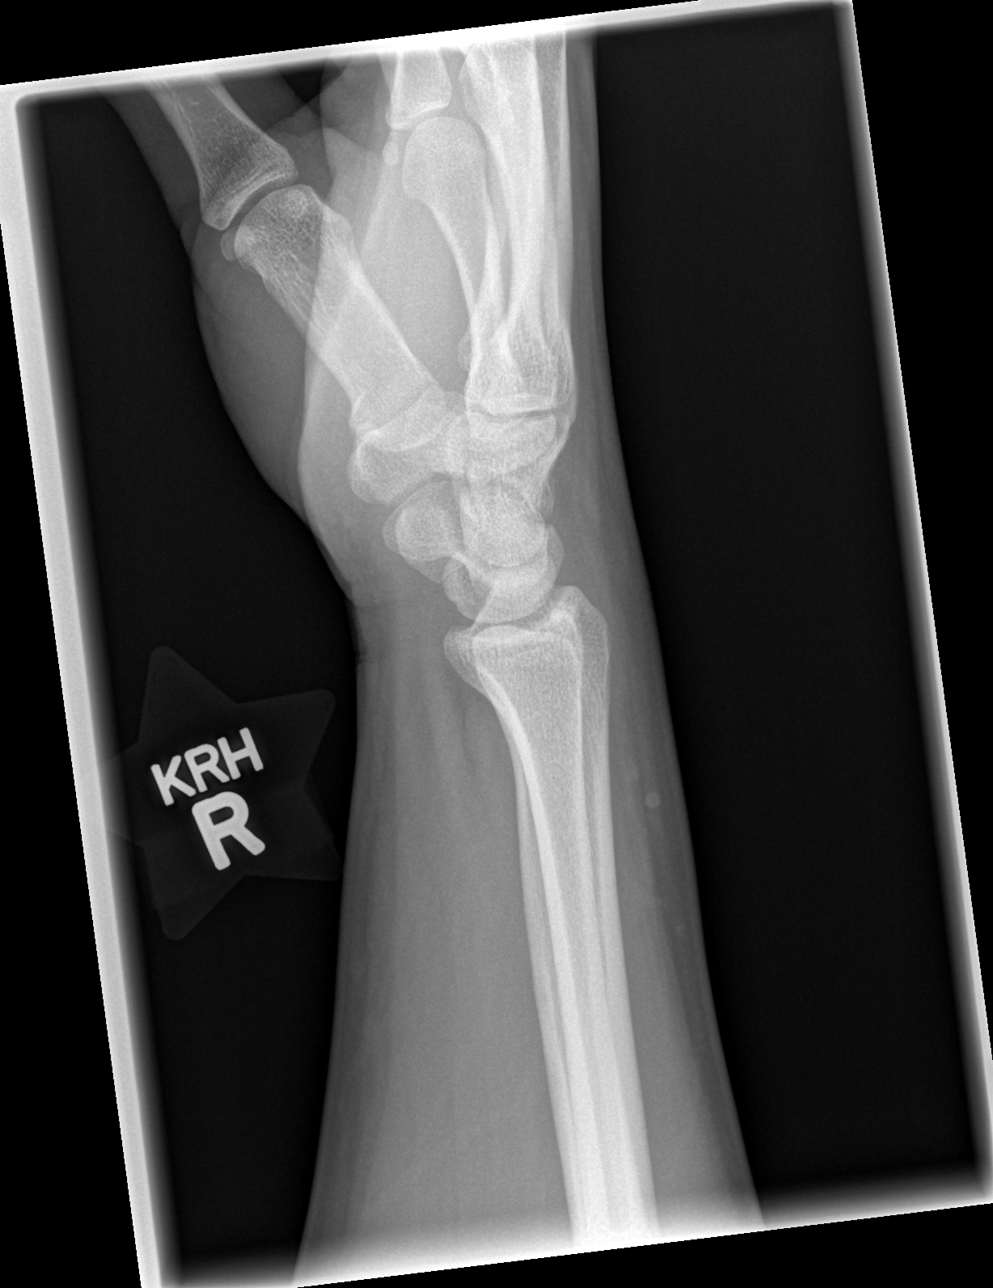

[x navicular]
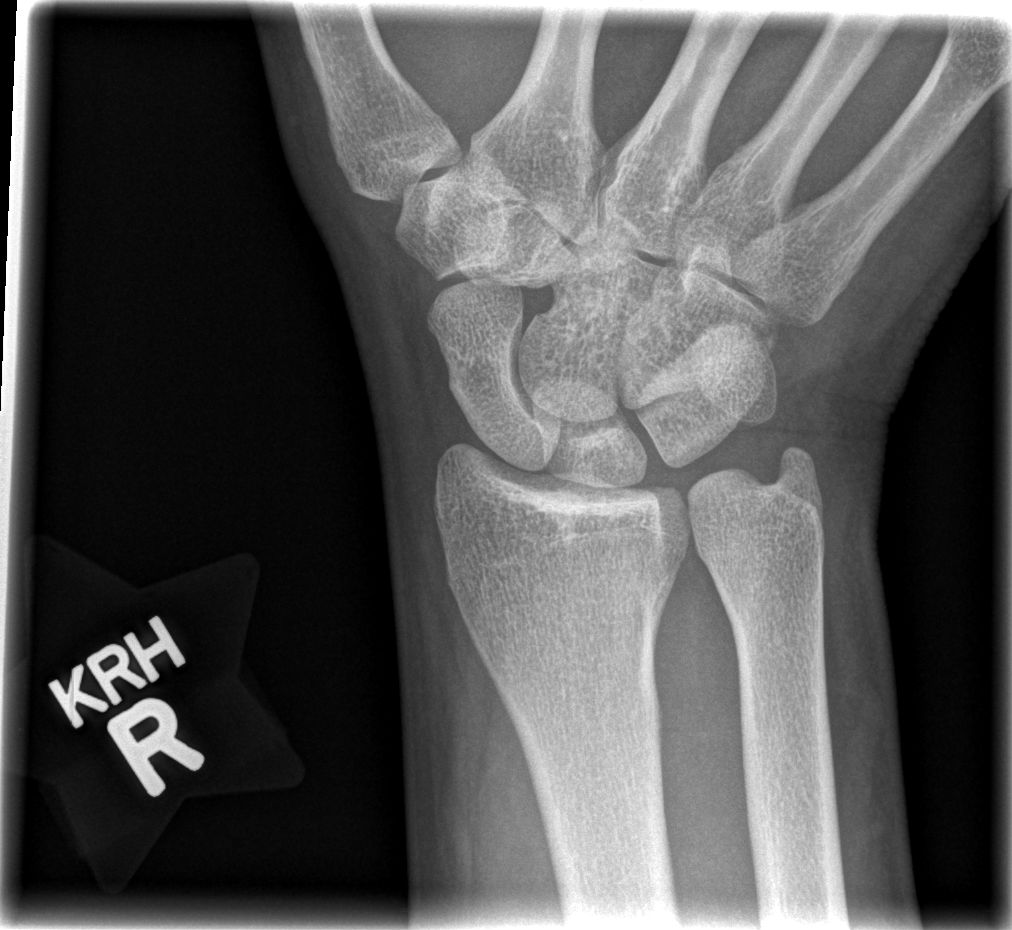

[4 of 4 positions shown; findings below may reference images not displayed]

FINDINGS: Three views of the right hand demonstrate no fracture or
dislocation. Joint spaces maintained.

Four views of the right wrist demonstrate no acute fracture
dislocation. Scaphoid intact. Joint spaces maintained. No focal
osseous lesion.
IMPRESSION: No acute findings about the right wrist or hand.

## 2016-06-24 ENCOUNTER — Emergency Department (HOSPITAL_BASED_OUTPATIENT_CLINIC_OR_DEPARTMENT_OTHER)
Admission: EM | Admit: 2016-06-24 | Discharge: 2016-06-25 | Disposition: A | Discharge status: Home / Self Care | Attending: Emergency Medicine | Admitting: Emergency Medicine

## 2016-06-24 ENCOUNTER — Encounter (HOSPITAL_BASED_OUTPATIENT_CLINIC_OR_DEPARTMENT_OTHER): Payer: Self-pay

## 2016-06-24 DIAGNOSIS — R51 Headache: Secondary | ICD-10-CM | POA: Diagnosis present

## 2016-06-24 DIAGNOSIS — G43009 Migraine without aura, not intractable, without status migrainosus: Secondary | ICD-10-CM | POA: Diagnosis not present

## 2016-06-24 MED ORDER — KETOROLAC TROMETHAMINE 30 MG/ML IJ SOLN
30.0000 mg | Freq: Once | INTRAMUSCULAR | Status: AC
  Administered 2016-06-24: 30 mg via INTRAVENOUS
  Filled 2016-06-24: qty 1

## 2016-06-24 MED ORDER — METOCLOPRAMIDE HCL 5 MG/ML IJ SOLN
10.0000 mg | Freq: Once | INTRAMUSCULAR | Status: AC
  Administered 2016-06-24: 10 mg via INTRAVENOUS
  Filled 2016-06-24: qty 2

## 2016-06-24 MED ORDER — DIPHENHYDRAMINE HCL 50 MG/ML IJ SOLN
25.0000 mg | Freq: Once | INTRAMUSCULAR | Status: AC
  Administered 2016-06-24: 25 mg via INTRAVENOUS
  Filled 2016-06-24: qty 1

## 2016-06-24 NOTE — ED Triage Notes (Signed)
C/o HA since 5pm-reports hx of migraine-NAD-steady gait

## 2016-06-24 NOTE — ED Provider Notes (Signed)
[facility] Provider Note   CSN: [id number] Arrival date & time: 06/24/16  2147  By signing my name below, I, Jerry Orr, attest that this documentation has been prepared under the direction and in the presence of [name], MD. Electronically Signed: [name], ED Scribe. 06/24/16. 11:27 PM.     History   Chief Complaint Chief Complaint  Patient presents with  . Headache    HPI Rosebud PolesKevin Orr is a 36 y.o. male with h/o migraine on qd Zonisamide, TBI who presents to the Emergency Department complaining of moderate, constant central and frontal HA than began at 5 PM. Per pt, his current HA is consistent with prior migraines. He states his HA is worsened with light and sound. Pt is not able to pin point a trigger of his current HA. No alleviating factors noted. He denies additional complaints.   The history is provided by the patient. No language interpreter was used.  Headache   This is a recurrent problem. The current episode started 6 to 12 hours ago. The problem occurs constantly. The problem has not changed since onset.The headache is associated with bright light and loud noise. The pain is located in the frontal region. The pain is moderate. The pain does not radiate. Pertinent negatives include no anorexia, no fever, no malaise/fatigue, no chest pressure, no near-syncope, no orthopnea, no palpitations, no syncope, no shortness of breath, no nausea and no vomiting. He has tried nothing for the symptoms. The treatment provided no relief.    Past Medical History:  Diagnosis Date  . Chronic back pain   . HOH (hard of hearing)   . Migraine   . PTSD (post-traumatic stress disorder)   . TBI (traumatic brain injury) (HCC)     There are no active problems to display for this patient.   Past Surgical History:  Procedure Laterality Date  . NASAL SEPTUM SURGERY         Home Medications    Prior to Admission medications   Medication Sig Start Date End Date  Taking? Authorizing Provider  desvenlafaxine (PRISTIQ) 50 MG 24 hr tablet Take 50 mg by mouth daily.    Historical Provider, MD  meloxicam (MOBIC) 15 MG tablet Take 15 mg by mouth daily.    Historical Provider, MD  tapentadol (NUCYNTA) 50 MG TABS tablet Take 50 mg by mouth 2 (two) times daily as needed.    Historical Provider, MD  zonisamide (ZONEGRAN) 100 MG capsule Take 300 mg by mouth daily.    Historical Provider, MD    Family History No family history on file.  Social History Social History  Substance Use Topics  . Smoking status: Never Smoker  . Smokeless tobacco: Never Used  . Alcohol use Yes     Comment: occ     Allergies   Patient has no known allergies.   Review of Systems Review of Systems  Constitutional: Negative for fever and malaise/fatigue.  Eyes: Positive for photophobia.  Respiratory: Negative for shortness of breath.   Cardiovascular: Negative for palpitations, orthopnea, syncope and near-syncope.  Gastrointestinal: Negative for anorexia, nausea and vomiting.  Musculoskeletal: Negative for neck pain and neck stiffness.  Neurological: Positive for headaches. Negative for tremors, seizures, syncope, facial asymmetry, speech difficulty, weakness, light-headedness and numbness.  All other systems reviewed and are negative.    Physical Exam Updated Vital Signs BP 132/96 (BP Location: Left Arm)   Pulse 82   Temp 97.5 F (36.4 C) (Oral)   Resp 20  Ht 6' (1.829 m)   Wt 220 lb (99.8 kg)   SpO2 99%   BMI 29.84 kg/m   Physical Exam  Constitutional: He is oriented to person, place, and time. He appears well-developed and well-nourished.  HENT:  Head: Normocephalic and atraumatic.  Mouth/Throat: Oropharynx is clear and moist. No oropharyngeal exudate.  Moist mucous membranes. No exudates.   Eyes: Conjunctivae and EOM are normal. Pupils are equal, round, and reactive to light.  Neck: Normal range of motion. Neck supple. No JVD present. No tracheal  deviation present. No thyromegaly present.  No carotid bruits. Trachea midline.   Cardiovascular: Normal rate, regular rhythm, normal heart sounds and intact distal pulses.  Exam reveals no gallop and no friction rub.   No murmur heard. RRR.   Pulmonary/Chest: Effort normal and breath sounds normal. No stridor. No respiratory distress. He has no wheezes. He has no rales.  Lungs CTA bilaterally.   Abdominal: Soft. Bowel sounds are normal. He exhibits no distension. There is no rebound and no guarding.  Musculoskeletal: Normal range of motion.  Lymphadenopathy:    He has no cervical adenopathy.  Neurological: He is alert and oriented to person, place, and time. He has normal reflexes. He displays normal reflexes. No cranial nerve deficit or sensory deficit. He exhibits normal muscle tone. Coordination normal.  Skin: Skin is warm and dry. Capillary refill takes less than 2 seconds.  Psychiatric: He has a normal mood and affect.  Nursing note and vitals reviewed.    ED Treatments / Results   Vitals:   06/24/16 2153 06/25/16 0040  BP: 132/96 100/70  Pulse: 82 70  Resp: 20 18  Temp: 97.5 F (36.4 C)     DIAGNOSTIC STUDIES: Oxygen Saturation is 99% on RA, normal by my interpretation.    COORDINATION OF CARE: 11:11 PM Discussed treatment plan with pt at bedside which includes pain management and pt agreed to plan.    No results found for this or any previous visit. No results found.   Radiology No results found.  Procedures Procedures (including critical care time)  Medications Ordered in ED  Medications  ketorolac (TORADOL) 30 MG/ML injection 30 mg (30 mg Intravenous Given 06/24/16 2329)  metoCLOPramide (REGLAN) injection 10 mg (10 mg Intravenous Given 06/24/16 2330)  diphenhydrAMINE (BENADRYL) injection 25 mg (25 mg Intravenous Given 06/24/16 2329)      Final Clinical Impressions(s) / ED Diagnoses  Migraine:  No atypical intact vitals and neuro exam.  No signs of  infection there is no indication for imaging at this time. All questions answered to patient's satisfaction. Based on history and exam patient has been appropriately medically screened and emergency conditions excluded. Patient is stable for discharge at this time. Strict return precautions given for any further episodes, persistent fever, weakness or any concerns. New Prescriptions New Prescriptions   No medications on file    I personally performed the services described in this documentation, which was scribed in my presence. The recorded information has been reviewed and is accurate.      Cy Blamer, MD 06/25/16 [id number]

## 2016-06-25 ENCOUNTER — Encounter (HOSPITAL_BASED_OUTPATIENT_CLINIC_OR_DEPARTMENT_OTHER): Payer: Self-pay | Admitting: Emergency Medicine

## 2017-02-02 ENCOUNTER — Emergency Department (HOSPITAL_BASED_OUTPATIENT_CLINIC_OR_DEPARTMENT_OTHER)
Admission: EM | Admit: 2017-02-02 | Discharge: 2017-02-02 | Disposition: A | Discharge status: Home / Self Care | Attending: Emergency Medicine | Admitting: Emergency Medicine

## 2017-02-02 ENCOUNTER — Encounter (HOSPITAL_BASED_OUTPATIENT_CLINIC_OR_DEPARTMENT_OTHER): Payer: Self-pay

## 2017-02-02 DIAGNOSIS — Z79899 Other long term (current) drug therapy: Secondary | ICD-10-CM | POA: Insufficient documentation

## 2017-02-02 DIAGNOSIS — G43009 Migraine without aura, not intractable, without status migrainosus: Secondary | ICD-10-CM | POA: Insufficient documentation

## 2017-02-02 DIAGNOSIS — G43909 Migraine, unspecified, not intractable, without status migrainosus: Secondary | ICD-10-CM | POA: Diagnosis present

## 2017-02-02 MED ORDER — ONDANSETRON HCL 4 MG/2ML IJ SOLN
INTRAMUSCULAR | Status: AC
  Administered 2017-02-02: 4 mg
  Filled 2017-02-02: qty 2

## 2017-02-02 MED ORDER — DIPHENHYDRAMINE HCL 50 MG/ML IJ SOLN
25.0000 mg | Freq: Once | INTRAMUSCULAR | Status: AC
  Administered 2017-02-02: 25 mg via INTRAVENOUS
  Filled 2017-02-02: qty 1

## 2017-02-02 MED ORDER — METOCLOPRAMIDE HCL 5 MG/ML IJ SOLN
10.0000 mg | Freq: Once | INTRAMUSCULAR | Status: AC
  Administered 2017-02-02: 10 mg via INTRAVENOUS
  Filled 2017-02-02: qty 2

## 2017-02-02 MED ORDER — KETOROLAC TROMETHAMINE 30 MG/ML IJ SOLN
30.0000 mg | Freq: Once | INTRAMUSCULAR | Status: AC
  Administered 2017-02-02: 30 mg via INTRAVENOUS
  Filled 2017-02-02: qty 1

## 2017-02-02 NOTE — ED Triage Notes (Signed)
Pt c/o migraine headache x2hrs, actively vomiting in triage

## 2017-02-02 NOTE — Discharge Instructions (Signed)

## 2017-02-02 NOTE — ED Provider Notes (Signed)
[facility]   CSN: [id number] Arrival date & time: 02/02/17  0014     History   Chief Complaint Chief Complaint  Patient presents with  . Migraine    HPI Jerry Orr is a 36 y.o. male.  The history is provided by the patient.  Migraine  This is a recurrent problem. The current episode started 3 to 5 hours ago. The problem occurs constantly. The problem has been gradually worsening. Associated symptoms include headaches. Pertinent negatives include no abdominal pain. Exacerbated by: light. Nothing relieves the symptoms. He has tried rest for the symptoms. The treatment provided no relief.  pt reports gradual onset of migraine type HA Similar to prior episodes No new weakness He had otherwise been well   Past Medical History:  Diagnosis Date  . Chronic back pain   . HOH (hard of hearing)   . Migraine   . PTSD (post-traumatic stress disorder)   . TBI (traumatic brain injury) (HCC)     There are no active problems to display for this patient.   Past Surgical History:  Procedure Laterality Date  . NASAL SEPTUM SURGERY         Home Medications    Prior to Admission medications   Medication Sig Start Date End Date Taking? Authorizing Provider  desvenlafaxine (PRISTIQ) 50 MG 24 hr tablet Take 50 mg by mouth daily.    [provider]  meloxicam (MOBIC) 15 MG tablet Take 15 mg by mouth daily.    [provider]  tapentadol (NUCYNTA) 50 MG TABS tablet Take 50 mg by mouth 2 (two) times daily as needed.    [provider]  zonisamide (ZONEGRAN) 100 MG capsule Take 300 mg by mouth daily.    [provider]    Family History No family history on file.  Social History Social History  Substance Use Topics  . Smoking status: Never Smoker  . Smokeless tobacco: Never Used  . Alcohol use Yes     Comment: occ     Allergies   Patient has no known allergies.   Review of Systems Review of Systems    Constitutional: Negative for fever.  Eyes: Positive for photophobia. Negative for visual disturbance.  Gastrointestinal: Negative for abdominal pain.  Neurological: Positive for headaches. Negative for speech difficulty and weakness.  All other systems reviewed and are negative.    Physical Exam Updated Vital Signs BP (!) 148/106 (BP Location: Right Arm)   Pulse 85   Temp 97.8 F (36.6 C) (Oral)   Resp 20   Ht 1.854 m ( )   Wt 100.7 kg (222 lb)   SpO2 99%   BMI 29.29 kg/m   Physical Exam  CONSTITUTIONAL: Well developed/well nourished HEAD: Normocephalic/atraumatic EYES: EOMI/PERRL, no nystagmus, no ptosis ENMT: Mucous membranes moist NECK: supple no meningeal signs, no bruits SPINE/BACK:entire spine nontender CV: S1/S2 noted, no murmurs/rubs/gallops noted LUNGS: Lungs are clear to auscultation bilaterally, no apparent distress ABDOMEN: soft, nontender, no rebound or guarding GU:no cva tenderness NEURO:Awake/alert, face symmetric, no arm or leg drift is noted Equal 5/5 strength with shoulder abduction, elbow flex/extension, wrist flex/extension in upper extremities and equal hand grips bilaterally Equal 5/5 strength with hip flexion,knee flex/extension, foot dorsi/plantar flexion Cranial nerves 3/4/5/6/11/16/08/11/12 tested and intact Gait normal without ataxia No past pointing Sensation to light touch intact in all extremities EXTREMITIES: pulses normal, full ROM SKIN: warm, color normal PSYCH: no abnormalities of mood noted, alert and oriented to situation  ED Treatments / Results  Labs (all labs ordered are listed, but only abnormal results are displayed) Labs Reviewed - No data to display  EKG  EKG Interpretation None       Radiology No results found.  Procedures Procedures (including critical care time)  Medications Ordered in ED Medications  ondansetron (ZOFRAN) 4 MG/2ML injection (4 mg  Given 02/02/17 0040)  metoCLOPramide (REGLAN) injection  10 mg (10 mg Intravenous Given 02/02/17 0215)  diphenhydrAMINE (BENADRYL) injection 25 mg (25 mg Intravenous Given 02/02/17 0214)  ketorolac (TORADOL) 30 MG/ML injection 30 mg (30 mg Intravenous Given 02/02/17 0215)     Initial Impression / Assessment and Plan / ED Course  I have reviewed the triage vital signs and the nursing notes.   Pt improved Ambulatory Will d/c home No red flags on this visit Discussed strict return precautions   Final Clinical Impressions(s) / ED Diagnoses   Final diagnoses:  Migraine without aura and without status migrainosus, not intractable    New Prescriptions New Prescriptions   No medications on file     Zadie Rhine, MD 02/02/17 [id number]

## 2017-06-04 ENCOUNTER — Emergency Department (HOSPITAL_BASED_OUTPATIENT_CLINIC_OR_DEPARTMENT_OTHER)
Admission: EM | Admit: 2017-06-04 | Discharge: 2017-06-04 | Disposition: A | Discharge status: Home / Self Care | Attending: Emergency Medicine | Admitting: Emergency Medicine

## 2017-06-04 ENCOUNTER — Encounter (HOSPITAL_BASED_OUTPATIENT_CLINIC_OR_DEPARTMENT_OTHER): Payer: Self-pay | Admitting: Adult Health

## 2017-06-04 ENCOUNTER — Other Ambulatory Visit: Payer: Self-pay

## 2017-06-04 DIAGNOSIS — Z79899 Other long term (current) drug therapy: Secondary | ICD-10-CM | POA: Insufficient documentation

## 2017-06-04 DIAGNOSIS — R110 Nausea: Secondary | ICD-10-CM | POA: Diagnosis not present

## 2017-06-04 DIAGNOSIS — R51 Headache: Secondary | ICD-10-CM | POA: Diagnosis present

## 2017-06-04 DIAGNOSIS — G43109 Migraine with aura, not intractable, without status migrainosus: Secondary | ICD-10-CM | POA: Insufficient documentation

## 2017-06-04 MED ORDER — SODIUM CHLORIDE 0.9 % IV BOLUS (SEPSIS)
1000.0000 mL | Freq: Once | INTRAVENOUS | Status: AC
Start: 2017-06-04 — End: 2017-06-04
  Administered 2017-06-04: 1000 mL via INTRAVENOUS

## 2017-06-04 MED ORDER — DIPHENHYDRAMINE HCL 50 MG/ML IJ SOLN
25.0000 mg | Freq: Once | INTRAMUSCULAR | Status: AC
  Administered 2017-06-04: 25 mg via INTRAVENOUS
  Filled 2017-06-04: qty 1

## 2017-06-04 MED ORDER — METOCLOPRAMIDE HCL 5 MG/ML IJ SOLN
10.0000 mg | Freq: Once | INTRAMUSCULAR | Status: AC
  Administered 2017-06-04: 10 mg via INTRAVENOUS
  Filled 2017-06-04: qty 2

## 2017-06-04 MED ORDER — KETOROLAC TROMETHAMINE 30 MG/ML IJ SOLN
30.0000 mg | Freq: Once | INTRAMUSCULAR | Status: AC
  Administered 2017-06-04: 30 mg via INTRAVENOUS
  Filled 2017-06-04: qty 1

## 2017-06-04 NOTE — ED Triage Notes (Signed)
Presents with migraine that began at 11 pm, associated with nausea, light and sound sensitivity, and nausea.

## 2017-06-04 NOTE — ED Provider Notes (Signed)
[facility] EMERGENCY DEPARTMENT Provider Note   CSN: [id number] Arrival date & time: 06/04/17  1746     History   Chief Complaint Chief Complaint  Patient presents with  . Migraine    HPI Jerry Orr is a 37 y.o. male.  HPI Patient has history of migraines.  He reports headache started today around noon.  Started quite abruptly.  Headache is generalized.  This is typical.  He reports this is severe pressure throughout the head.  Positive light sensitivity.  Nausea without vomiting.  No weakness numbness tingling or incoordination.  No fevers no chills.  Is not tried any medications for migraine today. Past Medical History:  Diagnosis Date  . Chronic back pain   . HOH (hard of hearing)   . Migraine   . PTSD (post-traumatic stress disorder)   . TBI (traumatic brain injury) (HCC)     There are no active problems to display for this patient.   Past Surgical History:  Procedure Laterality Date  . NASAL SEPTUM SURGERY         Home Medications    Prior to Admission medications   Medication Sig Start Date End Date Taking? Authorizing Provider  desvenlafaxine (PRISTIQ) 50 MG 24 hr tablet Take 50 mg by mouth daily.    [provider]  meloxicam (MOBIC) 15 MG tablet Take 15 mg by mouth daily.    [provider]  tapentadol (NUCYNTA) 50 MG TABS tablet Take 50 mg by mouth 2 (two) times daily as needed.    [provider]  zonisamide (ZONEGRAN) 100 MG capsule Take 300 mg by mouth daily.    [provider]    Family History History reviewed. No pertinent family history.  Social History Social History   Tobacco Use  . Smoking status: Never Smoker  . Smokeless tobacco: Never Used  Substance Use Topics  . Alcohol use: Yes    Comment: occ  . Drug use: No     Allergies   Patient has no known allergies.   Review of Systems Review of Systems 10 Systems reviewed and are negative for acute change except as noted in the  HPI.   Physical Exam Updated Vital Signs BP 119/83 (BP Location: Right Arm)   Pulse 89   Temp 98.4 F (36.9 C) (Oral)   Resp 16   SpO2 98%   Physical Exam  Constitutional: He is oriented to person, place, and time. He appears well-developed and well-nourished.  Patient is alert and nontoxic.  He appears to be in pain.  He is in a darkened room with eyes covered.  HENT:  Head: Normocephalic and atraumatic.  Nose: Nose normal.  Mouth/Throat: Oropharynx is clear and moist.  Eyes: Conjunctivae are normal.  Neck: Neck supple.  Cardiovascular: Normal rate and regular rhythm.  No murmur heard. Pulmonary/Chest: Effort normal and breath sounds normal. No respiratory distress.  Abdominal: Soft. There is no tenderness.  Musculoskeletal: Normal range of motion. He exhibits no edema or tenderness.  Neurological: He is alert and oriented to person, place, and time. No cranial nerve deficit. He exhibits normal muscle tone. Coordination normal.  Skin: Skin is warm and dry.  Psychiatric: He has a normal mood and affect.  Nursing note and vitals reviewed.    ED Treatments / Results  Labs (all labs ordered are listed, but only abnormal results are displayed) Labs Reviewed - No data to display  EKG  EKG Interpretation None       Radiology  No results found.  Procedures Procedures (including critical care time)  Medications Ordered in ED Medications  metoCLOPramide (REGLAN) injection 10 mg (10 mg Intravenous Given 06/04/17 2114)  diphenhydrAMINE (BENADRYL) injection 25 mg (25 mg Intravenous Given 06/04/17 2113)  ketorolac (TORADOL) 30 MG/ML injection 30 mg (30 mg Intravenous Given 06/04/17 2113)  sodium chloride 0.9 % bolus 1,000 mL (1,000 mLs Intravenous New Bag/Given 06/04/17 2113)     Initial Impression / Assessment and Plan / ED Course  I have reviewed the triage vital signs and the nursing notes.  Pertinent labs & imaging results that were available during my care of the  patient were reviewed by me and considered in my medical decision making (see chart for details).     Recheck: Patient feels much improved.  Final Clinical Impressions(s) / ED Diagnoses   Final diagnoses:  Migraine with aura and without status migrainosus, not intractable   Patient presents with classic migraine symptoms.  This is been typical for him.  He responded well to migraine therapy.  Patient reports he has home medications for migraine management. ED Discharge Orders    None       Arby BarrettePfeiffer, Timica Marcom, MD 06/04/17 2206

## 2017-12-11 ENCOUNTER — Emergency Department (HOSPITAL_BASED_OUTPATIENT_CLINIC_OR_DEPARTMENT_OTHER)
Admission: EM | Admit: 2017-12-11 | Discharge: 2017-12-11 | Disposition: A | Discharge status: Home / Self Care | Attending: Emergency Medicine | Admitting: Emergency Medicine

## 2017-12-11 ENCOUNTER — Other Ambulatory Visit: Payer: Self-pay

## 2017-12-11 ENCOUNTER — Encounter (HOSPITAL_BASED_OUTPATIENT_CLINIC_OR_DEPARTMENT_OTHER): Payer: Self-pay

## 2017-12-11 DIAGNOSIS — R519 Headache, unspecified: Secondary | ICD-10-CM

## 2017-12-11 DIAGNOSIS — Z87820 Personal history of traumatic brain injury: Secondary | ICD-10-CM | POA: Diagnosis not present

## 2017-12-11 DIAGNOSIS — Z79899 Other long term (current) drug therapy: Secondary | ICD-10-CM | POA: Diagnosis not present

## 2017-12-11 DIAGNOSIS — R51 Headache: Secondary | ICD-10-CM | POA: Diagnosis not present

## 2017-12-11 MED ORDER — KETOROLAC TROMETHAMINE 30 MG/ML IJ SOLN
30.0000 mg | Freq: Once | INTRAMUSCULAR | Status: AC
  Administered 2017-12-11: 30 mg via INTRAVENOUS
  Filled 2017-12-11: qty 1

## 2017-12-11 MED ORDER — METOCLOPRAMIDE HCL 5 MG/ML IJ SOLN
10.0000 mg | Freq: Once | INTRAMUSCULAR | Status: AC
  Administered 2017-12-11: 10 mg via INTRAVENOUS
  Filled 2017-12-11: qty 2

## 2017-12-11 MED ORDER — SODIUM CHLORIDE 0.9 % IV BOLUS
500.0000 mL | Freq: Once | INTRAVENOUS | Status: AC
  Administered 2017-12-11: 500 mL via INTRAVENOUS

## 2017-12-11 MED ORDER — DEXAMETHASONE SODIUM PHOSPHATE 10 MG/ML IJ SOLN
10.0000 mg | Freq: Once | INTRAMUSCULAR | Status: AC
  Administered 2017-12-11: 10 mg via INTRAVENOUS
  Filled 2017-12-11: qty 1

## 2017-12-11 NOTE — ED Provider Notes (Signed)
[facility] EMERGENCY DEPARTMENT Provider Note   CSN: [id number] Arrival date & time: 12/11/17  1313     History   Chief Complaint Chief Complaint  Patient presents with  . Migraine    HPI Jerry Orr is a 37 y.o. male with history of migraine, PTSD, TBI who presents with headache that began this morning.  He describes it as " little demons trying to get out of his eyes" and on the top of his head.  He denies any vision changes.  He has had associated photophobia and phonophobia.  It feels typical of his normal migraines.  He reports he takes Pristiq and Nucynta for preventative medication.  He did not take any extra medication prior to arrival.  He denies any nausea or vomiting, abdominal pain, chest pain, shortness of breath, neck pain, fevers.  Reports he gets migraines requiring him to go to the hospital about once a quarter.  He reports normal headache cocktail usually works very well for him.  HPI  Past Medical History:  Diagnosis Date  . Chronic back pain   . HOH (hard of hearing)   . Migraine   . PTSD (post-traumatic stress disorder)   . TBI (traumatic brain injury) (HCC)     There are no active problems to display for this patient.   Past Surgical History:  Procedure Laterality Date  . HYDROCELE EXCISION Right   . NASAL SEPTUM SURGERY          Home Medications    Prior to Admission medications   Medication Sig Start Date End Date Taking? Authorizing Provider  desvenlafaxine (PRISTIQ) 50 MG 24 hr tablet Take 50 mg by mouth daily.    [provider]  meloxicam (MOBIC) 15 MG tablet Take 15 mg by mouth daily.    [provider]  tapentadol (NUCYNTA) 50 MG TABS tablet Take 50 mg by mouth 2 (two) times daily as needed.    [provider]  zonisamide (ZONEGRAN) 100 MG capsule Take 300 mg by mouth daily.    [provider]    Family History No family history on file.  Social History Social History   Tobacco Use    . Smoking status: Never Smoker  . Smokeless tobacco: Never Used  Substance Use Topics  . Alcohol use: Yes    Comment: occ  . Drug use: No     Allergies   Patient has no known allergies.   Review of Systems Review of Systems  Constitutional: Negative for chills and fever.  HENT: Negative for facial swelling and sore throat.   Eyes: Positive for photophobia. Negative for visual disturbance.  Respiratory: Negative for shortness of breath.   Cardiovascular: Negative for chest pain.  Gastrointestinal: Negative for abdominal pain, nausea and vomiting.  Genitourinary: Negative for dysuria.  Musculoskeletal: Negative for back pain and neck pain.  Skin: Negative for rash and wound.  Neurological: Positive for headaches. Negative for weakness.  Psychiatric/Behavioral: The patient is not nervous/anxious.      Physical Exam Updated Vital Signs BP 115/76 (BP Location: Right Arm)   Pulse 78   Temp 97.7 F (36.5 C) (Oral)   Resp 18   Ht 6' (1.829 m)   Wt 108.9 kg (240 lb)   SpO2 100%   BMI 32.55 kg/m   Physical Exam  Constitutional: He appears well-developed and well-nourished. No distress.  HENT:  Head: Normocephalic and atraumatic.  Mouth/Throat: Oropharynx is clear and moist. No oropharyngeal exudate.  Eyes: Pupils are  equal, round, and reactive to light. Conjunctivae are normal. Right eye exhibits no discharge. Left eye exhibits no discharge. No scleral icterus.  Neck: Normal range of motion. Neck supple. No spinous process tenderness and no muscular tenderness present. No neck rigidity. Normal range of motion present. No thyromegaly present.  Cardiovascular: Normal rate, regular rhythm, normal heart sounds and intact distal pulses. Exam reveals no gallop and no friction rub.  No murmur heard. Pulmonary/Chest: Effort normal and breath sounds normal. No stridor. No respiratory distress. He has no wheezes. He has no rales.  Abdominal: Soft. Bowel sounds are normal. He  exhibits no distension. There is no tenderness. There is no rebound and no guarding.  Musculoskeletal: He exhibits no edema.  Lymphadenopathy:    He has no cervical adenopathy.  Neurological: He is alert. Coordination normal.  CN 3-12 intact; normal sensation throughout; 5/5 strength in all 4 extremities; equal bilateral grip strength; no ataxia on finger-to-nose  Skin: Skin is warm and dry. No rash noted. He is not diaphoretic. No pallor.  Psychiatric: He has a normal mood and affect.  Nursing note and vitals reviewed.    ED Treatments / Results  Labs (all labs ordered are listed, but only abnormal results are displayed) Labs Reviewed - No data to display  EKG None  Radiology No results found.  Procedures Procedures (including critical care time)  Medications Ordered in ED Medications  ketorolac (TORADOL) 30 MG/ML injection 30 mg (30 mg Intravenous Given 12/11/17 1526)  metoCLOPramide (REGLAN) injection 10 mg (10 mg Intravenous Given 12/11/17 1526)  dexamethasone (DECADRON) injection 10 mg (10 mg Intravenous Given 12/11/17 1526)  sodium chloride 0.9 % bolus 500 mL (500 mLs Intravenous New Bag/Given 12/11/17 1525)     Initial Impression / Assessment and Plan / ED Course  I have reviewed the triage vital signs and the nursing notes.  Pertinent labs & imaging results that were available during my care of the patient were reviewed by me and considered in my medical decision making (see chart for details).     Pt HA treated and improved while in ED.  Presentation is like pts typical HA and non concerning for [facility], ICH, Meningitis, or temporal arteritis. Pt is afebrile with no focal neuro deficits, nuchal rigidity, or change in vision.  Patient is followed by neurology and is on 2 prophylactic medications.  He reports he only gets about 4 migraines a year and would rather come to the hospital for treatment and take acute medications at home.  He has talked about this extensively with his  neurologist.  He is feeling much better and would like to go home.  Return precautions discussed.  Patient understands and agrees with plan.  Patient vitals stable throughout ED course and discharged in satisfactory condition.    Final Clinical Impressions(s) / ED Diagnoses   Final diagnoses:  Bad headache    ED Discharge Orders    None       Emi Holes, PA-C 12/11/17 1630    Charlynne Pander, MD 12/12/17 1504

## 2017-12-11 NOTE — ED Triage Notes (Signed)
Pt c/o migrane HA that started this AM. Photo/phonophobia present.

## 2017-12-11 NOTE — Discharge Instructions (Signed)
Please return to emergency department if you develop any new or worsening symptoms.
